# Patient Record
Sex: Male | Born: 1965
Health system: Southern US, Community
[De-identification: ages and names within clinical notes are randomized; demographics above are authoritative.]

## PROBLEM LIST (undated history)

## (undated) DIAGNOSIS — I10 Essential (primary) hypertension: Secondary | ICD-10-CM

## (undated) DIAGNOSIS — G35 Multiple sclerosis: Secondary | ICD-10-CM

## (undated) DIAGNOSIS — E785 Hyperlipidemia, unspecified: Secondary | ICD-10-CM

## (undated) DIAGNOSIS — K4020 Bilateral inguinal hernia, without obstruction or gangrene, not specified as recurrent: Secondary | ICD-10-CM

## (undated) DIAGNOSIS — N200 Calculus of kidney: Secondary | ICD-10-CM

## (undated) DIAGNOSIS — G709 Myoneural disorder, unspecified: Secondary | ICD-10-CM

## (undated) HISTORY — PX: SQUAMOUS CELL CARCINOMA EXCISION: SHX2433

## (undated) HISTORY — DX: Multiple sclerosis: G35

## (undated) HISTORY — PX: VASECTOMY: SHX75

## (undated) HISTORY — DX: Bilateral inguinal hernia, without obstruction or gangrene, not specified as recurrent: K40.20

## (undated) HISTORY — DX: Calculus of kidney: N20.0

## (undated) HISTORY — DX: Essential (primary) hypertension: I10

## (undated) HISTORY — DX: Myoneural disorder, unspecified: G70.9

## (undated) HISTORY — DX: Hyperlipidemia, unspecified: E78.5

---

## 2007-05-27 ENCOUNTER — Inpatient Hospital Stay (HOSPITAL_COMMUNITY): Admission: EM | Admit: 2007-05-27 | Discharge: 2007-05-28 | Payer: Self-pay | Admitting: Emergency Medicine

## 2011-02-06 NOTE — H&P (Signed)
[name] [name], [name] [name] NO.:  [id number]   MEDICAL RECORD NO.:  [id number]          PATIENT TYPE:  INP   LOCATION:  1846                         FACILITY:  [facility]   PHYSICIAN:  Bevelyn Buckles. Champey, M.D.DATE OF BIRTH:  10-12-1965   DATE OF ADMISSION:  05/27/2007  DATE OF DISCHARGE:                              HISTORY & PHYSICAL   REQUESTING PHYSICIAN:  Dr. Ethelda Chick   REASON FOR ADMISSION:  TIA.   HISTORY OF PRESENT ILLNESS:  [name] [name] is a 45 year old Caucasian male  with past medical history of neuropathy who presented to the emergency  room after a 10- to 15-minute episode of left upper extremity tingling  and weakness with feeling tightness in the fingers.  He is now back to  baseline.  He also felt slightly unsteady on his feet during episode and  denies any other symptoms of headaches, neck pain, vision changes,  speech changes, swallowing problems, chewing problems, vertigo or loss  of consciousness.   PAST MEDICAL HISTORY:  Positive for neuropathy.   CURRENT MEDICATIONS:  Include Neurontin which was just recently started  last week.   ALLERGIES:  PENICILLIN.   FAMILY HISTORY:  Positive for hypertension.   SOCIAL HISTORY:  The patient lives with his kids and wife.  Denies any  alcohol, tobacco or drug use.   REVIEW OF SYSTEMS:  Positive as per HPI for recent sinus  cold/congestion.  Review of systems negative as per history of present  illness in greater than seven other organ systems.   EXAMINATION:  VITAL SIGNS:  Temperature is 97.7, blood pressure 160/94,  pulse 86, respirations 88, O2 saturation 97%.  HEENT:  Normocephalic, atraumatic.  Extraocular muscles are intact.  Pupils equal, round, and react to light.  NECK:  Supple.  No carotid bruits.  HEART:  Regular.  LUNGS:  Clear.  ABDOMEN:  Soft.  EXTREMITIES:  Show good pulses.  NEUROLOGICAL EXAMINATION:  The patient is awake, alert and oriented.  Language is fluent.  Cranial nerves II-XII  grossly intact.  Motor  examination shows 4+/5 to 5/5 strength and normal tone in all four  extremities.  No drift is noted.  Sensory examination is within normal  limits to light touch.  Reflexes 1-2+ throughout and symmetric.  Toes  are neutral bilaterally.  Cerebellar function is within normal limits  finger-to-nose.  Gait is within normal limits.   PROCEDURES:  CT of the head:  The patient had no acute abnormalities and  congenital right cerebellar atrophy noted.   LABORATORIES:  WBC 6.18, hemoglobin 14.6, hematocrit is 42.2, platelets  232.  Sodium 138, potassium 4.2, chloride is 104, CO2 is 29, BUN 19,  creatinine is 1.2, glucose 104.   IMPRESSION:  This is a 45 year old with transient left upper extremity  tingling and weakness.  Will admit the patient to stroke MD.  The  patient not a candidate for IV tPA as the patient's symptoms have  resolved.  Will check an MRI/MRA of the brain and I will include an MRI  of the cervical spine.  Will get carotid Dopplers, 2-D echocardiogram.  We  will also check his fasting lipids, homocysteine level, and  hypercoagulable panel.  Will start an aspirin a day.  Will get PT/OT  consults.  Will follow the patient while he is in the hospital.      Bevelyn Buckles. Nash Shearer, M.D.  Electronically Signed     DRC/MEDQ  D:  05/27/2007  T:  05/27/2007  Job:  [id number]

## 2011-02-06 NOTE — Discharge Summary (Signed)
[name] [name], [name] NO.:  [id number]   MEDICAL RECORD NO.:  [id number]          PATIENT TYPE:  INP   LOCATION:  6743                         FACILITY:  [facility]   PHYSICIAN:  Bevelyn Buckles. Champey, M.D.DATE OF BIRTH:  [birth year]   DATE OF ADMISSION:  05/27/2007  DATE OF DISCHARGE:  05/28/2007                               DISCHARGE SUMMARY   REASON FOR ADMISSION:  Transient left upper extremity weakness/numbness.   DISCHARGE DIAGNOSES:  Demyelinating disease, probably multiple  sclerosis.   DISCHARGE MEDICATIONS:  Gabapentin 300 mg b.i.d. for 3 days and then  t.i.d.   HOSPITAL COURSE:  Please see admission H&P for details of admit note.  The patient was admitted for evaluation of possible TIA versus  structural lesions causing symptoms of left upper extremity  numbness/weakness.  MRI scan showed multiple foci and white matter on  the cervical spinal cord consistent with demyelinating disease.  Lumbar  puncture was performed with the patient's consent.  Puncture results WBC  3, RBC 490, glucose 72, protein 36.  Other results including CSF studies  are pending.  The patient had an uneventful hospital stay.  Was back to  baseline without any problems or complaints throughout his  hospitalization.  He was discharged home on Neurontin as previously  described.  We will await the results of the CSF studies.  I have given  the patient information regarding multiple sclerosis and treatment  options.  We will followup with the patient in 1 to 2 week's time to  followup CSF results and to discuss treatment options.  The patient was  also found during hospitalization to have a slightly elevated LDL of 115  and this needs to be closely followed by his primary care physician.  Other results including MRI, 2D echocardiogram and other laboratory  values are still pending upon discharge.   FOLLOWUP:  The patient is told to followup with his primary care  physician within 1 to 2  month's time.  To followup closely in regard to  his LDL and to follow with Dr. Nash Shearer in 1 to 2 week's time for  followup CSF studies and treatment options in the future.  We will  discharge the patient home.  The patient understands and agrees with the  plan.      Bevelyn Buckles. Nash Shearer, M.D.  Electronically Signed     DRC/MEDQ  D:  05/28/2007  T:  05/28/2007  Job:  [id number]

## 2011-07-06 LAB — CBC
HCT: 42.2
MCHC: 34.7
MCV: 85.9
Platelets: 232
RDW: 13.2
WBC: 6.8

## 2011-07-06 LAB — CSF CELL COUNT WITH DIFFERENTIAL
RBC Count, CSF: 490 — ABNORMAL HIGH
Tube #: 1
WBC, CSF: 3

## 2011-07-06 LAB — BETA-2-GLYCOPROTEIN I ABS, IGG/M/A
Beta-2-Glycoprotein I IgA: 9 U/mL (ref <10)
Beta-2-Glycoprotein I IgM: <4 U/mL (ref <10)

## 2011-07-06 LAB — URINALYSIS, ROUTINE W REFLEX MICROSCOPIC
Bilirubin Urine: NEGATIVE
Hgb urine dipstick: NEGATIVE
Ketones, ur: NEGATIVE
Specific Gravity, Urine: 1.014
pH: 7.5

## 2011-07-06 LAB — COMPREHENSIVE METABOLIC PANEL
Albumin: 4.2
Alkaline Phosphatase: 89
BUN: 15
Chloride: 104
Creatinine, Ser: 1
Glucose, Bld: 85
Potassium: 4.2
Total Bilirubin: 1.1
Total Protein: 7.6

## 2011-07-06 LAB — POCT I-STAT 3, ART BLOOD GAS (G3+)
Acid-Base Excess: 3 — ABNORMAL HIGH
O2 Saturation: 97
Operator id: 229971
Patient temperature: 97

## 2011-07-06 LAB — LUPUS ANTICOAGULANT PANEL
PTT Lupus Anticoagulant: 52.7 — ABNORMAL HIGH (ref 36.3–48.8)
PTTLA 4:1 Mix: 48.7 (ref 36.3–48.8)

## 2011-07-06 LAB — PROTEIN AND GLUCOSE, CSF
Glucose, CSF: 72
Total  Protein, CSF: 36

## 2011-07-06 LAB — CK TOTAL AND CKMB (NOT AT ARMC)
CK, MB: 0.8
Total CK: 69

## 2011-07-06 LAB — I-STAT 8, (EC8 V) (CONVERTED LAB)
Chloride: 104
Glucose, Bld: 104 — ABNORMAL HIGH
Hemoglobin: 15.6
Potassium: 4.2
Sodium: 138
TCO2: 29
pH, Ven: 7.381 — ABNORMAL HIGH

## 2011-07-06 LAB — DIFFERENTIAL
Basophils Absolute: 0.1
Basophils Relative: 1
Eosinophils Absolute: 0.1
Eosinophils Relative: 2
Lymphs Abs: 1.8
Neutrophils Relative %: 61

## 2011-07-06 LAB — POCT CARDIAC MARKERS
Myoglobin, poc: 67.5
Operator id: 279831

## 2011-07-06 LAB — HOMOCYSTEINE: Homocysteine: 8.7

## 2011-07-06 LAB — RAPID URINE DRUG SCREEN, HOSP PERFORMED
Amphetamines: NOT DETECTED
Barbiturates: NOT DETECTED
Benzodiazepines: NOT DETECTED
Cocaine: NOT DETECTED
Opiates: NOT DETECTED
Tetrahydrocannabinol: NOT DETECTED

## 2011-07-06 LAB — PROTHROMBIN GENE MUTATION

## 2011-07-06 LAB — HSV PCR
HSV 2 , PCR: NOT DETECTED
HSV, PCR: NOT DETECTED

## 2011-07-06 LAB — LIPASE, FLUID: Lipase-Fluid: <10

## 2011-07-06 LAB — OLIGOCLONAL BANDS, CSF + SERM
Albumin, CSF: 21 mg/dL (ref 0–35)
Albumin, Serum(Neph): 3550 mg/dL (ref 3500–5200)
CSF Oligoclonal Bands: POSITIVE — AB
IgG, Serum: 978 mg/dL (ref 768–1632)

## 2011-07-06 LAB — LIPID PANEL
Cholesterol: 189
LDL Cholesterol: 115 — ABNORMAL HIGH

## 2011-07-06 LAB — FACTOR 5 LEIDEN

## 2011-07-06 LAB — CARDIOLIPIN ANTIBODIES, IGG, IGM, IGA: Anticardiolipin IgA: <7 — ABNORMAL LOW (ref <13)

## 2011-07-06 LAB — PROTIME-INR: Prothrombin Time: 12.5

## 2011-07-06 LAB — VDRL, CSF: VDRL Quant, CSF: NONREACTIVE

## 2011-07-06 LAB — ETHANOL: Alcohol, Ethyl (B): <5

## 2011-07-06 LAB — HEMOGLOBIN A1C
Hgb A1c MFr Bld: 5.8
Mean Plasma Glucose: 129

## 2011-07-06 LAB — PROTEIN C ACTIVITY: Protein C Activity: 135 % — ABNORMAL HIGH (ref 75–133)

## 2011-07-06 LAB — PROTEIN S ACTIVITY: Protein S Activity: 145 % — ABNORMAL HIGH (ref 69–129)

## 2011-11-13 DIAGNOSIS — Z5181 Encounter for therapeutic drug level monitoring: Secondary | ICD-10-CM | POA: Insufficient documentation

## 2011-11-13 DIAGNOSIS — G35 Multiple sclerosis: Secondary | ICD-10-CM | POA: Insufficient documentation

## 2012-07-07 ENCOUNTER — Encounter (INDEPENDENT_AMBULATORY_CARE_PROVIDER_SITE_OTHER): Payer: Self-pay

## 2012-07-18 ENCOUNTER — Encounter (INDEPENDENT_AMBULATORY_CARE_PROVIDER_SITE_OTHER): Payer: Self-pay | Admitting: Surgery

## 2012-07-18 ENCOUNTER — Ambulatory Visit (INDEPENDENT_AMBULATORY_CARE_PROVIDER_SITE_OTHER): Payer: Managed Care, Other (non HMO) | Admitting: Surgery

## 2012-07-18 VITALS — BP 130/84 | HR 76 | Resp 14 | Ht 63.0 in | Wt 129.6 lb

## 2012-07-18 DIAGNOSIS — K4020 Bilateral inguinal hernia, without obstruction or gangrene, not specified as recurrent: Secondary | ICD-10-CM

## 2012-07-18 NOTE — Progress Notes (Signed)
Patient ID: [name] [name], male   DOB: 26-Aug-1966, 46 y.o.   MRN: [id number]  No chief complaint on file.   HPI [name] [name] is a 46 y.o. male.  Patient presents at the request of Dr. Isabel Caprice do to a bulge in his right groin region. It has been present for a couple of months. It is not causing severe pain. He has noticed a bulge in his left groin recently. It is not painful. Both areas slide in and out. No change in his bowel or bladder function. HPI  Past Medical History  Diagnosis Date  . Nephrolithiasis   . Neuromuscular disorder     MS    Past Surgical History  Procedure Date  . Vasectomy     Family History  Problem Relation Age of Onset  . Colon cancer      Social History History  Substance Use Topics  . Smoking status: Never Smoker   . Smokeless tobacco: Not on file  . Alcohol Use: Yes    Allergies  Allergen Reactions  . Penicillins     Current Outpatient Prescriptions  Medication Sig Dispense Refill  . Cyanocobalamin (VITAMIN B 12 PO) Take by mouth.      Marland Kitchen GABAPENTIN PO Take by mouth.      . glatiramer (COPAXONE) 20 MG/ML injection Inject 20 mg into the skin daily.      . Multiple Vitamin (MULTIVITAMIN WITH MINERALS) TABS Take 1 tablet by mouth daily.        Review of Systems Review of Systems  Constitutional: Negative for fever, chills and unexpected weight change.  HENT: Negative for hearing loss, congestion, sore throat, trouble swallowing and voice change.   Eyes: Negative for visual disturbance.  Respiratory: Negative for cough and wheezing.   Cardiovascular: Negative for chest pain, palpitations and leg swelling.  Gastrointestinal: Negative for nausea, vomiting, abdominal pain, diarrhea, constipation, blood in stool, abdominal distention, anal bleeding and rectal pain.  Genitourinary: Negative for hematuria and difficulty urinating.  Musculoskeletal: Negative for arthralgias.  Skin: Negative for rash and wound.  Neurological: Negative for  seizures, syncope, weakness and headaches.  Hematological: Negative for adenopathy. Does not bruise/bleed easily.  Psychiatric/Behavioral: Negative for confusion.    Blood pressure 130/84, pulse 76, resp. rate 14, height 5\' 3"  (1.6 m), weight 129 lb 9.6 oz (58.786 kg).  Physical Exam Physical Exam  Constitutional: He is oriented to person, place, and time. He appears well-developed and well-nourished.  HENT:  Head: Normocephalic and atraumatic.  Eyes: EOM are normal. Pupils are equal, round, and reactive to light.  Neck: Normal range of motion. Neck supple.  Cardiovascular: Normal rate and regular rhythm.   Pulmonary/Chest: Effort normal and breath sounds normal.  Abdominal:    Musculoskeletal: Normal range of motion.  Neurological: He is alert and oriented to person, place, and time.  Skin: Skin is warm and dry.  Psychiatric: He has a normal mood and affect. His behavior is normal. Judgment and thought content normal.      Assessment    BIH    Plan    Discussed options of repair versus observation. Risks, benefits and alternative therapies discussed and long term expectations. Both laparoscopic and open repairs discussed in detail the pros and cons of each discussed. He would like to proceed with laparoscopic bilateral inguinal hernia repair with mesh.The risk of hernia repair include bleeding,  Infection,   Recurrence of the hernia,  Mesh use, chronic pain,  Organ injury,  Bowel injury,  Bladder injury,   nerve injury with numbness around the incision,  Death,  and worsening of preexisting  medical problems.  The alternatives to surgery have been discussed as well..  Long term expectations of both operative and non operative treatments have been discussed.   The patient agrees to proceed.       [name] [name] A. 07/18/2012, 11:47 AM

## 2012-07-18 NOTE — Patient Instructions (Addendum)
Hernia  A hernia occurs when an internal organ pushes out through a weak spot in the abdominal wall. Hernias most commonly occur in the groin and around the navel. Hernias often can be pushed back into place (reduced). Most hernias tend to get worse over time. Some abdominal hernias can get stuck in the opening (irreducible or incarcerated hernia) and cannot be reduced. An irreducible abdominal hernia which is tightly squeezed into the opening is at risk for impaired blood supply (strangulated hernia). A strangulated hernia is a medical emergency. Because of the risk for an irreducible or strangulated hernia, surgery may be recommended to repair a hernia.  CAUSES   · Heavy lifting.  · Prolonged coughing.  · Straining to have a bowel movement.  · A cut (incision) made during an abdominal surgery.  HOME CARE INSTRUCTIONS   · Bed rest is not required. You may continue your normal activities.  · Avoid lifting more than 10 pounds (4.5 kg) or straining.  · Cough gently. If you are a smoker it is best to stop. Even the best hernia repair can break down with the continual strain of coughing. Even if you do not have your hernia repaired, a cough will continue to aggravate the problem.  · Do not wear anything tight over your hernia. Do not try to keep it in with an outside bandage or truss. These can damage abdominal contents if they are trapped within the hernia sac.  · Eat a normal diet.  · Avoid constipation. Straining over long periods of time will increase hernia size and encourage breakdown of repairs. If you cannot do this with diet alone, stool softeners may be used.  SEEK IMMEDIATE MEDICAL CARE IF:   · You have a fever.  · You develop increasing abdominal pain.  · You feel nauseous or vomit.  · Your hernia is stuck outside the abdomen, looks discolored, feels hard, or is tender.  · You have any changes in your bowel habits or in the hernia that are unusual for you.  · You have increased pain or swelling around the  hernia.  · You cannot push the hernia back in place by applying gentle pressure while lying down.  MAKE SURE YOU:   · Understand these instructions.  · Will watch your condition.  · Will get help right away if you are not doing well or get worse.  Document Released: 09/10/2005 Document Revised: 12/03/2011 Document Reviewed: 04/29/2008  ExitCare® Patient Information ©2013 [employer].    Hernia Repair with Laparoscope  A hernia occurs when an internal organ pushes out through a weak spot in the belly (abdominal) wall muscles. Hernias most commonly occur in the groin and around the navel. Hernias can also occur through a cut by the surgeon (incision) after an abdominal operation. A hernia may be caused by:  · Lifting heavy objects.  · Prolonged coughing.  · Straining to move your bowels.  Hernias can often be pushed back into place (reduced). Most hernias tend to get worse over time. Problems occur when abdominal contents get stuck in the opening and the blood supply is blocked or impaired (incarcerated hernia). Because of these risks, you require surgery to repair the hernia.  Your hernia will be repaired using a laparoscope. Laparoscopic surgery is a type of minimally invasive surgery. It does not involve making a typical surgical cut (incision) in the skin. A laparoscope is a telescope-like rod and lens system. It is usually connected to a video camera and   a light source so your caregiver can clearly see the operative area. The instruments are inserted through ¼ to ½ inch (5 mm or 10 mm) openings in the skin at specific locations. A working and viewing space is created by blowing a small amount of carbon dioxide gas into the abdominal cavity. The abdomen is essentially blown up like a balloon (insufflated). This elevates the abdominal wall above the internal organs like a dome. The carbon dioxide gas is common to the human body and can be absorbed by tissue and removed by the respiratory system. Once the repair  is completed, the small incisions will be closed with either stitches (sutures) or staples (just like a paper stapler only this staple holds the skin together).  LET YOUR CAREGIVERS KNOW ABOUT:  · Allergies.  · Medications taken including herbs, eye drops, over the counter medications, and creams.  · Use of steroids (by mouth or creams).  · Previous problems with anesthetics or Novocaine.  · Possibility of pregnancy, if this applies.  · History of blood clots (thrombophlebitis).  · History of bleeding or blood problems.  · Previous surgery.  · Other health problems.  BEFORE THE PROCEDURE   Laparoscopy can be done either in a hospital or out-patient clinic. You may be given a mild sedative to help you relax before the procedure. Once in the operating room, you will be given a general anesthesia to make you sleep (unless you and your caregiver choose a different anesthetic).   AFTER THE PROCEDURE   After the procedure you will be watched in a recovery area. Depending on what type of hernia was repaired, you might be admitted to the hospital or you might go home the same day. With this procedure you may have less pain and scarring. This usually results in a quicker recovery and less risk of infection.  HOME CARE INSTRUCTIONS   · Bed rest is not required. You may continue your normal activities but avoid heavy lifting (more than 10 pounds) or straining.  · Cough gently. If you are a smoker it is best to stop, as even the best hernia repair can break down with the continual strain of coughing.  · Avoid driving until given the OK by your surgeon.  · There are no dietary restrictions unless given otherwise.  · TAKE ALL MEDICATIONS AS DIRECTED.  · Only take over-the-counter or prescription medicines for pain, discomfort, or fever as directed by your caregiver.  SEEK MEDICAL CARE IF:   · There is increasing abdominal pain or pain in your incisions.  · There is more bleeding from incisions, other than minimal spotting.  · You  feel light headed or faint.  · You develop an unexplained fever, chills, and/or an oral temperature above 102° F (38.9° C).  · You have redness, swelling, or increasing pain in the wound.  · Pus coming from wound.  · A foul smell coming from the wound or dressings.  SEEK IMMEDIATE MEDICAL CARE IF:   · You develop a rash.  · You have difficulty breathing.  · You have any allergic problems.  MAKE SURE YOU:   · Understand these instructions.  · Will watch your condition.  · Will get help right away if you are not doing well or get worse.  Document Released: 09/10/2005 Document Revised: 12/03/2011 Document Reviewed: 08/10/2009  ExitCare® Patient Information ©2013 [employer].

## 2012-08-19 ENCOUNTER — Other Ambulatory Visit (INDEPENDENT_AMBULATORY_CARE_PROVIDER_SITE_OTHER): Payer: Self-pay | Admitting: Surgery

## 2012-08-19 ENCOUNTER — Ambulatory Visit
Admission: RE | Admit: 2012-08-19 | Discharge: 2012-08-19 | Disposition: A | Discharge status: Home / Self Care | Payer: Managed Care, Other (non HMO) | Source: Ambulatory Visit | Attending: Surgery | Admitting: Surgery

## 2012-08-19 DIAGNOSIS — Z01811 Encounter for preprocedural respiratory examination: Secondary | ICD-10-CM

## 2012-08-27 DIAGNOSIS — K4020 Bilateral inguinal hernia, without obstruction or gangrene, not specified as recurrent: Secondary | ICD-10-CM

## 2012-08-27 HISTORY — PX: HERNIA REPAIR: SHX51

## 2012-09-12 ENCOUNTER — Encounter (INDEPENDENT_AMBULATORY_CARE_PROVIDER_SITE_OTHER): Payer: Self-pay | Admitting: Surgery

## 2012-09-12 ENCOUNTER — Ambulatory Visit (INDEPENDENT_AMBULATORY_CARE_PROVIDER_SITE_OTHER): Payer: Managed Care, Other (non HMO) | Admitting: Surgery

## 2012-09-12 VITALS — BP 120/76 | HR 74 | Temp 97.6°F | Resp 14 | Ht 64.0 in | Wt 127.2 lb

## 2012-09-12 DIAGNOSIS — Z9889 Other specified postprocedural states: Secondary | ICD-10-CM

## 2012-09-12 NOTE — Patient Instructions (Signed)
Lift up to 25 lbs for 2 weeks then no restriction.  If swelling not gone in 3 months or increasing pain return.

## 2012-09-12 NOTE — Progress Notes (Signed)
Pt returns today after laparoscopic bilateral inguinal  hernia repair.  Pain is well controlled.  Bowels are functioning.  Wound is clean.  On exam:  Incision is clean /dry/intact.  Area is soft without signs of hernia recurrence. Right groin seroma noted  Impression:  Status repair of hernia laparoscopic bilateral inguinal   Plan:  RTC PRN  Return to work in   1 Weeks. If swelling in right groin not gone in 2 - 3 months or increasing pain return to clinic

## 2013-01-07 ENCOUNTER — Encounter: Payer: Self-pay | Admitting: Neurology

## 2013-01-07 DIAGNOSIS — G35 Multiple sclerosis: Secondary | ICD-10-CM

## 2013-01-07 DIAGNOSIS — Z5181 Encounter for therapeutic drug level monitoring: Secondary | ICD-10-CM

## 2013-01-13 ENCOUNTER — Ambulatory Visit (INDEPENDENT_AMBULATORY_CARE_PROVIDER_SITE_OTHER): Payer: Managed Care, Other (non HMO) | Admitting: Neurology

## 2013-01-13 ENCOUNTER — Encounter: Payer: Self-pay | Admitting: Neurology

## 2013-01-13 VITALS — BP 113/71 | HR 67 | Ht 65.5 in | Wt 128.0 lb

## 2013-01-13 DIAGNOSIS — Z5181 Encounter for therapeutic drug level monitoring: Secondary | ICD-10-CM

## 2013-01-13 DIAGNOSIS — G35 Multiple sclerosis: Secondary | ICD-10-CM

## 2013-01-13 NOTE — Progress Notes (Signed)
Reason for visit: Multiple sclerosis  [name] [name] is an 47 y.o. male  History of present illness:  [name] [name] is a 47 year old right-handed white male with a history of multiple sclerosis. The patient indicates that he has done quite well since last seen. The patient has had no new symptoms of numbness, weakness, gait instability, vision changes, speech changes, swallowing changes, or problems with the bowels or the bladder. The patient is on Copaxone, and he is tolerating the medication well. The patient reports no skin reactions. The patient denies any new significant medical issues that have come up since last seen. The patient returns for an evaluation.  Past Medical History  Diagnosis Date  . Nephrolithiasis   . Neuromuscular disorder     MS  . Bilateral inguinal hernia   . Multiple sclerosis     Past Surgical History  Procedure Laterality Date  . Vasectomy    . Hernia repair  08/27/12    bilateral repair with mesh  . Squamous cell carcinoma excision      From the forehead    Family History  Problem Relation Age of Onset  . Colon cancer    . Heart attack Sister     Social history:  reports that he has never smoked. He has never used smokeless tobacco. He reports that  drinks alcohol. He reports that he does not use illicit drugs.  Allergies:  Allergies  Allergen Reactions  . Penicillins Other (See Comments)    Reaction in childhood. Does not remember.    Medications:  Current Outpatient Prescriptions on File Prior to Visit  Medication Sig Dispense Refill  . Cyanocobalamin (VITAMIN B 12 PO) Take by mouth.      . gabapentin (NEURONTIN) 300 MG capsule Take 300 mg by mouth 3 (three) times daily.      Marland Kitchen glatiramer (COPAXONE) 20 MG/ML injection Inject 20 mg into the skin daily.      . Multiple Vitamin (MULTIVITAMIN WITH MINERALS) TABS Take 1 tablet by mouth daily.       No current facility-administered medications on file prior to visit.    ROS:  Out of a  complete 14 system review of symptoms, the patient complains only of the following symptoms, and all other reviewed systems are negative.  Numbness Allergies Moles Insomnia  Blood pressure 113/71, pulse 67, height 5' 5.5" (1.664 m), weight 128 lb (58.06 kg).  Physical Exam  General: The patient is alert and cooperative at the time of the examination.  Skin: No significant peripheral edema is noted.   Neurologic Exam  Cranial nerves: Facial symmetry is present. Speech is normal, no aphasia or dysarthria is noted. Extraocular movements are full. Visual fields are full. Pupils are equal, round, and reactive to light. Discs are flat bilaterally.  Motor: The patient has good strength in all 4 extremities.  Coordination: The patient has good finger-nose-finger and heel-to-shin bilaterally.  Gait and station: The patient has a normal gait. Tandem gait is normal. Romberg is negative. No drift is seen.  Reflexes: Deep tendon reflexes are symmetric.   Assessment/Plan:  One. Multiple sclerosis  The patient is doing quite well at this point on Copaxone. The patient may be switched to the 3 times a week injectable form of Copaxone at this time. The patient will otherwise followup in one year. Blood work will be done today.  Marlan Palau MD 01/13/2013 8:32 PM  [facility] [address] [place], [place] [address]  Phone  [phone] Fax [phone]

## 2013-01-14 ENCOUNTER — Telehealth: Payer: Self-pay | Admitting: Neurology

## 2013-01-14 LAB — COMPREHENSIVE METABOLIC PANEL
AST: 25 IU/L (ref 0–40)
Albumin: 4.6 g/dL (ref 3.5–5.5)
Alkaline Phosphatase: 81 IU/L (ref 39–117)
BUN/Creatinine Ratio: 30 — ABNORMAL HIGH (ref 9–20)
BUN: 33 mg/dL — ABNORMAL HIGH (ref 6–24)
CO2: 23 mmol/L (ref 19–28)
Creatinine, Ser: 1.1 mg/dL (ref 0.76–1.27)
GFR calc Af Amer: 93 mL/min/{1.73_m2} (ref >59)
Globulin, Total: 2.2 g/dL (ref 1.5–4.5)
Sodium: 140 mmol/L (ref 134–144)
Total Bilirubin: 0.3 mg/dL (ref 0.0–1.2)

## 2013-01-14 LAB — CBC WITH DIFFERENTIAL/PLATELET
Basophils Absolute: 0 10*3/uL (ref 0.0–0.2)
Eosinophils Absolute: 0.2 10*3/uL (ref 0.0–0.4)
Immature Grans (Abs): 0 10*3/uL (ref 0.0–0.1)
Immature Granulocytes: 0 % (ref 0–2)
Lymphs: 19 % (ref 14–46)
MCH: 29.5 pg (ref 26.6–33.0)
MCHC: 34.1 g/dL (ref 31.5–35.7)
MCV: 87 fL (ref 79–97)
Monocytes Absolute: 1 10*3/uL — ABNORMAL HIGH (ref 0.1–0.9)
Neutrophils Relative %: 71 % (ref 40–74)
RBC: 4.81 x10E6/uL (ref 4.14–5.80)
RDW: 13.5 % (ref 12.3–15.4)

## 2013-01-14 NOTE — Telephone Encounter (Signed)
I Called the patient. The blood work reveals evidence of a slightly elevated white blood count. The patient is contact me if he believes that there are any infectious issues such as sinusitis. The patient otherwise had a slightly elevated BUN, otherwise normal creatinine.

## 2013-02-17 ENCOUNTER — Other Ambulatory Visit: Payer: Self-pay

## 2013-02-17 MED ORDER — GLATIRAMER ACETATE 40 MG/ML ~~LOC~~ SOSY: 40.0000 mg | PREFILLED_SYRINGE | SUBCUTANEOUS | Status: DC

## 2013-02-21 ENCOUNTER — Other Ambulatory Visit: Payer: Self-pay | Admitting: Neurology

## 2013-07-28 ENCOUNTER — Other Ambulatory Visit: Payer: Self-pay | Admitting: Neurology

## 2013-09-30 ENCOUNTER — Telehealth: Payer: Self-pay | Admitting: Neurology

## 2013-09-30 ENCOUNTER — Ambulatory Visit: Payer: Managed Care, Other (non HMO) | Admitting: Neurology

## 2013-09-30 NOTE — Telephone Encounter (Signed)
This patient did not show for the revisit appointment today.

## 2013-10-05 ENCOUNTER — Ambulatory Visit: Payer: Managed Care, Other (non HMO) | Admitting: Neurology

## 2014-01-15 ENCOUNTER — Telehealth: Payer: Self-pay | Admitting: Neurology

## 2014-01-15 NOTE — Telephone Encounter (Signed)
I called patient. Being off of Copaxone for one month likely will not harm things, but I will see if I can get free samples through the pharmaceutical copy for the month of May.

## 2014-01-15 NOTE — Telephone Encounter (Signed)
I called the patient. We have samples apparently of Copaxone in the sample room. He will come in Monday to get the medication. The pharmaceutical representative will bring in more samples to help supply him through the month of May.

## 2014-01-15 NOTE — Telephone Encounter (Signed)
Pt calling stating that he is between insurance policies and his new insurance starts 02/22/14 and pt wanted to know if he could be off of copaxone 40 mg for one month or is there another option for him. Please advise

## 2014-01-15 NOTE — Telephone Encounter (Signed)
Message copied by Margette Fast on Fri Jan 15, 2014  4:55 PM ------      Message from: Yates Decamp      Created: Fri Jan 15, 2014  2:14 PM      Regarding: Copaxone       Dr Jannifer Franklin,      We can absolutely provide a month of Copaxone samples for this patient.  We have some on hand at the office.  They are in the refrigerator behind the door in the sample room (underneath the shelves that have overstock of samples).  I contacted the rep, who will also bring more in next week.      Hope you have a great weekend!      Thanks!      JCB   ------

## 2014-01-15 NOTE — Telephone Encounter (Signed)
In between insurance policies. And new insurance starts 02/22/14 and he needs to know if it will be ok for him to be off of his Glatiramer Acetate (COPAXONE) 40 MG/ML SOSY  for one month and if there are any other options for him

## 2014-01-18 NOTE — Telephone Encounter (Signed)
Pt came into the office today to pick up a month supply of Copaxone.

## 2014-02-24 ENCOUNTER — Telehealth: Payer: Self-pay | Admitting: Neurology

## 2014-02-24 MED ORDER — GLATIRAMER ACETATE 40 MG/ML ~~LOC~~ SOSY: 40.0000 mg | PREFILLED_SYRINGE | SUBCUTANEOUS | Status: DC

## 2014-02-24 NOTE — Telephone Encounter (Signed)
Pharmacy has been added and refill has been sent.

## 2014-02-24 NOTE — Telephone Encounter (Signed)
Patient calling to state that he has a new Geneticist, molecular and the pharmacy will be [facility] [id number] [id number]. Giving information so he can get his Copaxone refills

## 2014-03-02 ENCOUNTER — Encounter: Payer: Self-pay | Admitting: Neurology

## 2014-03-15 ENCOUNTER — Ambulatory Visit: Payer: Managed Care, Other (non HMO) | Admitting: Neurology

## 2014-03-23 ENCOUNTER — Other Ambulatory Visit: Payer: Self-pay | Admitting: Neurology

## 2014-04-05 ENCOUNTER — Encounter: Payer: Self-pay | Admitting: Neurology

## 2014-04-05 ENCOUNTER — Ambulatory Visit (INDEPENDENT_AMBULATORY_CARE_PROVIDER_SITE_OTHER): Payer: 59 | Admitting: Neurology

## 2014-04-05 ENCOUNTER — Encounter (INDEPENDENT_AMBULATORY_CARE_PROVIDER_SITE_OTHER): Payer: Self-pay

## 2014-04-05 VITALS — BP 123/73 | HR 66 | Wt 134.0 lb

## 2014-04-05 DIAGNOSIS — G35 Multiple sclerosis: Secondary | ICD-10-CM

## 2014-04-05 DIAGNOSIS — Z5181 Encounter for therapeutic drug level monitoring: Secondary | ICD-10-CM

## 2014-04-05 LAB — COMPREHENSIVE METABOLIC PANEL
ALBUMIN: 4.4 g/dL (ref 3.5–5.5)
ALK PHOS: 71 IU/L (ref 39–117)
ALT: 9 IU/L (ref 0–44)
AST: 13 IU/L (ref 0–40)
Albumin/Globulin Ratio: 1.8 (ref 1.1–2.5)
BILIRUBIN TOTAL: 0.4 mg/dL (ref 0.0–1.2)
BUN / CREAT RATIO: 17 (ref 9–20)
BUN: 19 mg/dL (ref 6–24)
CO2: 30 mmol/L — AB (ref 18–29)
CREATININE: 1.15 mg/dL (ref 0.76–1.27)
Calcium: 9 mg/dL (ref 8.7–10.2)
Chloride: 102 mmol/L (ref 96–108)
GFR calc non Af Amer: 76 mL/min/{1.73_m2} (ref >59)
GFR, EST AFRICAN AMERICAN: 88 mL/min/{1.73_m2} (ref >59)
GLOBULIN, TOTAL: 2.4 g/dL (ref 1.5–4.5)
Glucose: 91 mg/dL (ref 65–99)
Potassium: 4.4 mmol/L (ref 3.5–5.2)
Sodium: 140 mmol/L (ref 134–144)
Total Protein: 6.8 g/dL (ref 6.0–8.5)

## 2014-04-05 LAB — CBC WITH DIFFERENTIAL
Basophils Absolute: 0.1 10*3/uL (ref 0.0–0.2)
Basos: 1 %
EOS: 7 %
Eosinophils Absolute: 0.4 10*3/uL (ref 0.0–0.4)
HCT: 40 % (ref 37.5–51.0)
Hemoglobin: 13.8 g/dL (ref 12.6–17.7)
LYMPHS ABS: 2.5 10*3/uL (ref 0.7–3.1)
Lymphs: 40 %
MCH: 29.8 pg (ref 26.6–33.0)
MCHC: 34.5 g/dL (ref 31.5–35.7)
MCV: 86 fL (ref 79–97)
MONOS ABS: 0.8 10*3/uL (ref 0.1–0.9)
Monocytes: 13 %
NEUTROS ABS: 2.5 10*3/uL (ref 1.4–7.0)
Neutrophils Relative %: 39 %
PLATELETS: 223 10*3/uL (ref 150–379)
RBC: 4.63 x10E6/uL (ref 4.14–5.80)
RDW: 13.4 % (ref 12.3–15.4)
WBC: 6.2 10*3/uL (ref 3.4–10.8)

## 2014-04-05 MED ORDER — GABAPENTIN 300 MG PO CAPS: 300.0000 mg | ORAL_CAPSULE | Freq: Three times a day (TID) | ORAL | Status: DC

## 2014-04-05 NOTE — Progress Notes (Signed)
Reason for visit: Multiple sclerosis  [name] [name] is an 48 y.o. male  History of present illness:  [name] is a 48 year old right-handed white male with a history of multiple sclerosis. The patient has been on Copaxone, and he is tolerating the medication well. He last had MRI evaluation the brain in February 2013 which was stable from the prior study in 2008. He denies any injection site reactions or other side effects on the Copaxone. He reports no new issues with numbness, weakness, balance problems, visual field changes, or bowel or bladder control problems. he does have some burning sensations in the feet, and he takes gabapentin for this. This helps him sleep. The patient actually takes only 300 mg twice daily, not 3 times daily. He is on vitamin B12 supplementation, but he does not currently take vitamin D. He returns for an evaluation. The patient indicates that he gets annual ophthalmologic evaluations through Dr. Herbert Deaner. The patient does report some irritability that is a new issue for him.  Past Medical History  Diagnosis Date  . Nephrolithiasis   . Neuromuscular disorder     MS  . Bilateral inguinal hernia   . Multiple sclerosis     Past Surgical History  Procedure Laterality Date  . Vasectomy    . Hernia repair  08/27/12    bilateral repair with mesh  . Squamous cell carcinoma excision      From the forehead    Family History  Problem Relation Age of Onset  . Colon cancer    . Heart attack Sister     Social history:  reports that he has never smoked. He has never used smokeless tobacco. He reports that he drinks alcohol. He reports that he does not use illicit drugs.    Allergies  Allergen Reactions  . Penicillins Other (See Comments)    Reaction in childhood. Does not remember.    Medications:  Current Outpatient Prescriptions on File Prior to Visit  Medication Sig Dispense Refill  . COPAXONE 40 MG/ML SOSY Inject subcutaneously 40mg   three times  weekly  12 mL  1  . Multiple Vitamin (MULTIVITAMIN WITH MINERALS) TABS Take 1 tablet by mouth daily.      . Olopatadine HCl (PATANASE) 0.6 % SOLN Place 1 each into the nose at bedtime.       No current facility-administered medications on file prior to visit.    ROS:  Out of a complete 14 system review of symptoms, the patient complains only of the following symptoms, and all other reviewed systems are negative.  Environmental allergies Numbness Agitation Moles Snoring  Blood pressure 123/73, pulse 66, weight 134 lb (60.782 kg).  Physical Exam  General: The patient is alert and cooperative at the time of the examination.  Skin: No significant peripheral edema is noted.   Neurologic Exam  Mental status: The patient is oriented x 3.  Cranial nerves: Facial symmetry is present. Speech is normal, no aphasia or dysarthria is noted. Extraocular movements are full. Visual fields are full. Pupils are equal, round, and reactive to light. Discs are flat bilaterally.  Motor: The patient has good strength in all 4 extremities.  Sensory examination: Soft touch sensation is symmetric on the face, arms, and legs.  Coordination: The patient has good finger-nose-finger and heel-to-shin bilaterally.  Gait and station: The patient has a normal gait. Tandem gait is normal. Romberg is negative. No drift is seen.  Reflexes: Deep tendon reflexes are symmetric.   MRI  brain 11/22/11:   Impression: Abnormal MRI brain (with and without contrast) demonstrating: 1. Hazy T2 hyperintense lesions in the posterior limb of internal capsule on the right, and a few small lesions in the left periventricular and subcortical regions. Some of these hyperintense on T1 views. Consistent with chronic demyelinating plaques. 2. No acute demyelinating plaques.  3. In comparison to the prior MRI from 05/28/07, there are no new lesions.   Assessment/Plan:  One. Multiple sclerosis  The patient is doing well on  Copaxone currently. He will have blood work done today. We will see him back in about 6 months, and we will consider getting MRI evaluation of the brain at that time.  Jill Alexanders MD 04/05/2014 7:53 PM  [facility] [address] [place] [address], [place] [address]  Phone [phone] Fax [phone]

## 2014-04-05 NOTE — Patient Instructions (Signed)

## 2014-04-06 NOTE — Progress Notes (Signed)
Quick Note:  Shared unremarkable results thru VM message ______

## 2014-07-07 ENCOUNTER — Other Ambulatory Visit: Payer: Self-pay | Admitting: Neurology

## 2014-10-06 ENCOUNTER — Ambulatory Visit (INDEPENDENT_AMBULATORY_CARE_PROVIDER_SITE_OTHER): Payer: BLUE CROSS/BLUE SHIELD | Admitting: Adult Health

## 2014-10-06 ENCOUNTER — Encounter: Payer: Self-pay | Admitting: Adult Health

## 2014-10-06 VITALS — BP 133/87 | HR 69 | Ht 65.0 in | Wt 144.8 lb

## 2014-10-06 DIAGNOSIS — Z5181 Encounter for therapeutic drug level monitoring: Secondary | ICD-10-CM

## 2014-10-06 DIAGNOSIS — G35 Multiple sclerosis: Secondary | ICD-10-CM

## 2014-10-06 MED ORDER — GLATIRAMER ACETATE 40 MG/ML ~~LOC~~ SOSY: PREFILLED_SYRINGE | SUBCUTANEOUS | Status: DC

## 2014-10-06 MED ORDER — GABAPENTIN 300 MG PO CAPS: 300.0000 mg | ORAL_CAPSULE | Freq: Three times a day (TID) | ORAL | Status: DC

## 2014-10-06 NOTE — Patient Instructions (Signed)
Overall you are doing well.  Continue Copaxone.  If symptoms worsen or you develops new symptoms please let us know.

## 2014-10-06 NOTE — Progress Notes (Signed)
PATIENT: [name] [name] DOB: 07-01-1966  REASON FOR VISIT: follow up- multiple sclerosis HISTORY FROM: patient  HISTORY OF PRESENT ILLNESS: [name] [name] is a 49 year old male with a history of multiple sclerosis. He returns today for follow-up. He is currently taking Copaxone and tolerating the medication well. The patient denies any new numbness or weakness. Denies any changes in his balance or gait. No changes in his bowels or bladder. Denies any vision changes. The patient continues to take gabapentin for discomfort in the feet. He states that this is working well. No new medical issues since last seen.  HISTORY 04/05/14 (WILLIS): [name] [name] is a 49 year old right-handed white male with a history of multiple sclerosis. The patient has been on Copaxone, and he is tolerating the medication well. He last had MRI evaluation the brain in February 2013 which was stable from the prior study in 2008. He denies any injection site reactions or other side effects on the Copaxone. He reports no new issues with numbness, weakness, balance problems, visual field changes, or bowel or bladder control problems. he does have some burning sensations in the feet, and he takes gabapentin for this. This helps him sleep. The patient actually takes only 300 mg twice daily, not 3 times daily. He is on vitamin B12 supplementation, but he does not currently take vitamin D. He returns for an evaluation. The patient indicates that he gets annual ophthalmologic evaluations through Dr. Herbert Deaner. The patient does report some irritability that is a new issue for him.  REVIEW OF SYSTEMS: Out of a complete 14 system review of symptoms, the patient complains only of the following symptoms, and all other reviewed systems are negative.  Constipation, difficulty urinating, environmental allergies, numbness, snoring  ALLERGIES: Allergies  Allergen Reactions  . Penicillins Other (See Comments)    Reaction in childhood. Does not  remember.    HOME MEDICATIONS: Outpatient Prescriptions Prior to Visit  Medication Sig Dispense Refill  . cholecalciferol (VITAMIN D) 1000 UNITS tablet Take 1,000 Units by mouth daily.    Marland Kitchen COPAXONE 40 MG/ML SOSY Inject subcutaneously 40mg   three times weekly 12 mL 6  . gabapentin (NEURONTIN) 300 MG capsule Take 1 capsule (300 mg total) by mouth 3 (three) times daily. 270 capsule 3  . Multiple Vitamin (MULTIVITAMIN WITH MINERALS) TABS Take 1 tablet by mouth daily.    . Olopatadine HCl (PATANASE) 0.6 % SOLN Place 1 each into the nose at bedtime.    . vitamin B-12 (CYANOCOBALAMIN) 1000 MCG tablet Take 1,000 mcg by mouth daily.     No facility-administered medications prior to visit.    PAST MEDICAL HISTORY: Past Medical History  Diagnosis Date  . Nephrolithiasis   . Neuromuscular disorder     MS  . Bilateral inguinal hernia   . Multiple sclerosis     PAST SURGICAL HISTORY: Past Surgical History  Procedure Laterality Date  . Vasectomy    . Hernia repair  08/27/12    bilateral repair with mesh  . Squamous cell carcinoma excision      From the forehead    FAMILY HISTORY: Family History  Problem Relation Age of Onset  . Colon cancer    . Heart attack Sister     SOCIAL HISTORY: History   Social History  . Marital Status: Married    Spouse Name: N/A    Number of Children: 3  . Years of Education: College   Occupational History  .     Social History  Main Topics  . Smoking status: Never Smoker   . Smokeless tobacco: Never Used  . Alcohol Use: Yes  . Drug Use: No  . Sexual Activity: Not on file   Other Topics Concern  . Not on file   Social History Narrative      PHYSICAL EXAM  Filed Vitals:   10/06/14 1319  BP: 133/87  Pulse: 69  Height: 5\' 5"  (1.651 m)  Weight: 144 lb 12.8 oz (65.681 kg)   Body mass index is 24.1 kg/(m^2).  Generalized: Well developed, in no acute distress   Neurological examination  Mentation: Alert oriented to time, place,  history taking. Follows all commands speech and language fluent Cranial nerve II-XII: Pupils were equal round reactive to light. Extraocular movements were full, visual field were full on confrontational test. Facial sensation and strength were normal. Uvula tongue midline. Head turning and shoulder shrug  were normal and symmetric. Motor: The motor testing reveals 5 over 5 strength of all 4 extremities. Good symmetric motor tone is noted throughout.  Sensory: Sensory testing is intact to soft touch on all 4 extremities. No evidence of extinction is noted.  Coordination: Cerebellar testing reveals good finger-nose-finger and heel-to-shin bilaterally.  Gait and station: Gait is normal. Tandem gait is normal. Romberg is negative. No drift is seen.  Reflexes: Deep tendon reflexes are symmetric and normal bilaterally.    DIAGNOSTIC DATA (LABS, IMAGING, TESTING) - I reviewed patient records, labs, notes, testing and imaging myself where available.  MRI BRAIN 2013- no progression when compared to scan in 2008  Lab Results  Component Value Date   WBC 6.2 04/05/2014   HGB 13.8 04/05/2014   HCT 40.0 04/05/2014   MCV 86 04/05/2014   PLT 223 04/05/2014      Component Value Date/Time   NA 140 04/05/2014 1023   NA 138 05/27/2007 1405   K 4.4 04/05/2014 1023   CL 102 04/05/2014 1023   CO2 30* 04/05/2014 1023   GLUCOSE 91 04/05/2014 1023   GLUCOSE 85 05/27/2007 1405   BUN 19 04/05/2014 1023   BUN 15 05/27/2007 1405   CREATININE 1.15 04/05/2014 1023   CALCIUM 9.0 04/05/2014 1023   PROT 6.8 04/05/2014 1023   PROT 7.6 05/27/2007 1405   ALBUMIN 4.2 05/27/2007 1405   AST 13 04/05/2014 1023   ALT 9 04/05/2014 1023   ALKPHOS 71 04/05/2014 1023   BILITOT 0.4 04/05/2014 1023   GFRNONAA 76 04/05/2014 1023   GFRAA 88 04/05/2014 1023   Lab Results  Component Value Date   CHOL  05/27/2007    189        ATP III CLASSIFICATION:  <200     mg/dL   Desirable  200-239  mg/dL   Borderline High   >=240    mg/dL   High   HDL 27* 05/27/2007   LDLCALC * 05/27/2007    115        Total Cholesterol/HDL:CHD Risk Coronary Heart Disease Risk Table                     Men   Women  1/2 Average Risk   3.4   3.3   TRIG 237* 05/27/2007   CHOLHDL 7.0 05/27/2007   Lab Results  Component Value Date   HGBA1C  05/27/2007    5.8 (NOTE)   The ADA recommends the following therapeutic goals for glycemic   control related to Hgb A1C measurement:   Goal of Therapy:   <  7.0% Hgb A1C   Action Suggested:  > 8.0% Hgb A1C   Ref:  Diabetes Care, 61, Suppl. 1, 1999      ASSESSMENT AND PLAN 49 y.o. year old male  has a past medical history of Nephrolithiasis; Neuromuscular disorder; Bilateral inguinal hernia; and Multiple sclerosis. here with:  1. Multiple sclerosis  Overall the patient is doing well today. He will continue taking Copaxone injections. I sent a new prescription to his new pharmacy. The patient will continue using gabapentin for discomfort in the feet. I sent a new prescription to his new pharmacy. The patient's last MRI was in 2013 and showed no progression from the previous scan in 2008. I will order a MRI of the brain to look for progression of MS. I will check blood work today. If his symptoms worsen or he develops new symptoms he should let us know. Otherwise he'll follow-up in 6 months or sooner if needed.   Ward Givens, MSN, NP-C 10/06/2014, 1:32 PM [facility] [address] [place], [place] [address] [phone]  Note: This document was prepared with digital dictation and possible smart phrase technology. Any transcriptional errors that result from this process are unintentional.

## 2014-10-06 NOTE — Progress Notes (Signed)
I have read the note, and I agree with the clinical assessment and plan.  WILLIS,CHARLES KEITH   

## 2014-10-07 LAB — CBC WITH DIFFERENTIAL
Basophils Absolute: 0.1 10*3/uL (ref 0.0–0.2)
Basos: 1 %
Eos: 6 %
Eosinophils Absolute: 0.4 10*3/uL (ref 0.0–0.4)
HCT: 40.6 % (ref 37.5–51.0)
Hemoglobin: 14.2 g/dL (ref 12.6–17.7)
Immature Grans (Abs): 0 10*3/uL (ref 0.0–0.1)
Immature Granulocytes: 0 %
Lymphocytes Absolute: 2.7 10*3/uL (ref 0.7–3.1)
Lymphs: 42 %
MCH: 29.6 pg (ref 26.6–33.0)
MCHC: 35 g/dL (ref 31.5–35.7)
MCV: 85 fL (ref 79–97)
MONOCYTES: 12 %
MONOS ABS: 0.8 10*3/uL (ref 0.1–0.9)
NEUTROS PCT: 39 %
Neutrophils Absolute: 2.6 10*3/uL (ref 1.4–7.0)
Platelets: 238 10*3/uL (ref 150–379)
RBC: 4.8 x10E6/uL (ref 4.14–5.80)
RDW: 13 % (ref 12.3–15.4)
WBC: 6.6 10*3/uL (ref 3.4–10.8)

## 2014-10-07 LAB — COMPREHENSIVE METABOLIC PANEL
ALT: 9 IU/L (ref 0–44)
AST: 13 IU/L (ref 0–40)
Albumin/Globulin Ratio: 1.8 (ref 1.1–2.5)
Albumin: 4.4 g/dL (ref 3.5–5.5)
Alkaline Phosphatase: 72 IU/L (ref 39–117)
BILIRUBIN TOTAL: 0.3 mg/dL (ref 0.0–1.2)
BUN / CREAT RATIO: 17 (ref 9–20)
BUN: 20 mg/dL (ref 6–24)
CO2: 23 mmol/L (ref 18–29)
Calcium: 9.2 mg/dL (ref 8.7–10.2)
Chloride: 102 mmol/L (ref 97–108)
Creatinine, Ser: 1.19 mg/dL (ref 0.76–1.27)
GFR calc Af Amer: 84 mL/min/{1.73_m2} (ref >59)
GFR, EST NON AFRICAN AMERICAN: 72 mL/min/{1.73_m2} (ref >59)
Globulin, Total: 2.5 g/dL (ref 1.5–4.5)
Glucose: 103 mg/dL — ABNORMAL HIGH (ref 65–99)
POTASSIUM: 4.5 mmol/L (ref 3.5–5.2)
Sodium: 140 mmol/L (ref 134–144)
Total Protein: 6.9 g/dL (ref 6.0–8.5)

## 2014-11-10 ENCOUNTER — Telehealth: Payer: Self-pay | Admitting: Adult Health

## 2014-11-10 NOTE — Telephone Encounter (Signed)
I contacted ins and provided all clinical info.  Request is under review.  I called the patient back, got no answer.  Left message.

## 2014-11-10 NOTE — Telephone Encounter (Signed)
Patient stated [employer] needing additional information for Rx Glatiramer Acetate (COPAXONE) 40 MG/ML SOSY.  Please call and advise.

## 2014-11-30 ENCOUNTER — Telehealth: Payer: Self-pay | Admitting: Adult Health

## 2014-11-30 MED ORDER — GLATIRAMER ACETATE 40 MG/ML ~~LOC~~ SOSY
40.0000 mg | PREFILLED_SYRINGE | SUBCUTANEOUS | Status: DC
Start: 2014-11-30 — End: 2015-09-16

## 2014-11-30 NOTE — Telephone Encounter (Signed)
[facility] is calling stating they need a Rx for Glatiramer Acetate (COPAXONE) 40 MG/ML SOSY to be faxed to them at [phone] along with the Rx they need ICD 910 and Drug and allergy list.  If you have any questions please call.

## 2015-04-06 ENCOUNTER — Encounter: Payer: Self-pay | Admitting: Adult Health

## 2015-04-06 ENCOUNTER — Ambulatory Visit (INDEPENDENT_AMBULATORY_CARE_PROVIDER_SITE_OTHER): Payer: BLUE CROSS/BLUE SHIELD | Admitting: Adult Health

## 2015-04-06 VITALS — BP 126/90 | HR 72 | Ht 65.0 in | Wt 141.0 lb

## 2015-04-06 DIAGNOSIS — G35 Multiple sclerosis: Secondary | ICD-10-CM

## 2015-04-06 NOTE — Progress Notes (Signed)
I have read the note, and I agree with the clinical assessment and plan.  Arlena Marsan KEITH   

## 2015-04-06 NOTE — Progress Notes (Signed)
PATIENT: [name] [name] DOB: Jan 24, 1966  REASON FOR VISIT: follow up- multiple sclerosis  HISTORY FROM: patient  HISTORY OF PRESENT ILLNESS: [name] [name] is a 49 year old male with a history of multiple sclerosis. He returns today for follow-up. He is currently taking Copaxone and tolerating it well. The patient denies any new numbness or weakness. Denies any changes in his gait or balance. No changes in his bowels or bladder. He does state that he's noticed that he's had more difficulty with urinating. He states that he feels that he has to go but it takes him a while to urinate. Denies urinary frequency or urgency. No changes in his vision. The patient does state that he just returned home for [place]. He states while there it was very hot outside and they were doing a lot of walking. He states that he's felt a little more fatigued after the trip but is slowly getting back to normal. He denies any new medical issues. Returns today for an evaluation.  HISTORY 10/06/14: [name] [name] is a 49 year old male with a history of multiple sclerosis. He returns today for follow-up. He is currently taking Copaxone and tolerating the medication well. The patient denies any new numbness or weakness. Denies any changes in his balance or gait. No changes in his bowels or bladder. Denies any vision changes. The patient continues to take gabapentin for discomfort in the feet. He states that this is working well. No new medical issues since last seen.  HISTORY 04/05/14 (WILLIS): [name] [name] is a 49 year old right-handed white male with a history of multiple sclerosis. The patient has been on Copaxone, and he is tolerating the medication well. He last had MRI evaluation the brain in February 2013 which was stable from the prior study in 2008. He denies any injection site reactions or other side effects on the Copaxone. He reports no new issues with numbness, weakness, balance problems, visual field changes, or bowel  or bladder control problems. he does have some burning sensations in the feet, and he takes gabapentin for this. This helps him sleep. The patient actually takes only 300 mg twice daily, not 3 times daily. He is on vitamin B12 supplementation, but he does not currently take vitamin D. He returns for an evaluation. The patient indicates that he gets annual ophthalmologic evaluations through Dr. Herbert Deaner. The patient does report some irritability that is a new issue for him.   REVIEW OF SYSTEMS: Out of a complete 14 system review of symptoms, the patient complains only of the following symptoms, and all other reviewed systems are negative.  Environmental allergies, difficulty urinating, and numbness, muscle cramps  ALLERGIES: Allergies  Allergen Reactions  . Penicillins Other (See Comments)    Reaction in childhood. Does not remember.    HOME MEDICATIONS: Outpatient Prescriptions Prior to Visit  Medication Sig Dispense Refill  . cholecalciferol (VITAMIN D) 1000 UNITS tablet Take 1,000 Units by mouth daily.    Marland Kitchen gabapentin (NEURONTIN) 300 MG capsule Take 1 capsule (300 mg total) by mouth 3 (three) times daily. 270 capsule 3  . Glatiramer Acetate (COPAXONE) 40 MG/ML SOSY Inject 40 mg into the skin 3 (three) times a week. 12 Syringe 6  . Multiple Vitamin (MULTIVITAMIN WITH MINERALS) TABS Take 1 tablet by mouth daily.    . Olopatadine HCl (PATANASE) 0.6 % SOLN Place 1 each into the nose at bedtime.    . vitamin B-12 (CYANOCOBALAMIN) 1000 MCG tablet Take 1,000 mcg by mouth daily.  No facility-administered medications prior to visit.    PAST MEDICAL HISTORY: Past Medical History  Diagnosis Date  . Nephrolithiasis   . Neuromuscular disorder     MS  . Bilateral inguinal hernia   . Multiple sclerosis     PAST SURGICAL HISTORY: Past Surgical History  Procedure Laterality Date  . Vasectomy    . Hernia repair  08/27/12    bilateral repair with mesh  . Squamous cell carcinoma excision        From the forehead    FAMILY HISTORY: Family History  Problem Relation Age of Onset  . Colon cancer    . Heart attack Sister     SOCIAL HISTORY: History   Social History  . Marital Status: Married    Spouse Name: N/A  . Number of Children: 3  . Years of Education: College   Occupational History  .     Social History Main Topics  . Smoking status: Never Smoker   . Smokeless tobacco: Never Used  . Alcohol Use: 0.0 oz/week    0 Standard drinks or equivalent per week     Comment: OCC  . Drug Use: No  . Sexual Activity: Not on file   Other Topics Concern  . Not on file   Social History Narrative   Patient lives at home with his wife Lattie Haw) and two daughters.   Patient works full time.   Education college.   Right handed.   Caffeine three sodas daily.      PHYSICAL EXAM  Filed Vitals:   04/06/15 0829  BP: 126/90  Pulse: 72  Height: 5\' 5"  (1.651 m)  Weight: 141 lb (63.957 kg)   Body mass index is 23.46 kg/(m^2).  Generalized: Well developed, in no acute distress   Neurological examination  Mentation: Alert oriented to time, place, history taking. Follows all commands speech and language fluent Cranial nerve II-XII: Pupils were equal round reactive to light. Extraocular movements were full, visual field were full on confrontational test. Facial sensation and strength were normal. Uvula tongue midline. Head turning and shoulder shrug  were normal and symmetric. Motor: The motor testing reveals 5 over 5 strength of all 4 extremities. Good symmetric motor tone is noted throughout.  Sensory: Sensory testing is intact to soft touch on all 4 extremities. No evidence of extinction is noted.  Coordination: Cerebellar testing reveals good finger-nose-finger and heel-to-shin bilaterally.  Gait and station: Gait is normal. Tandem gait is normal. Romberg is negative. No drift is seen.  Reflexes: Deep tendon reflexes are symmetric and normal bilaterally.   DIAGNOSTIC  DATA (LABS, IMAGING, TESTING) - I reviewed patient records, labs, notes, testing and imaging myself where available.  Lab Results  Component Value Date   WBC 6.6 10/06/2014   HGB 14.2 10/06/2014   HCT 40.6 10/06/2014   MCV 85 10/06/2014   PLT 238 10/06/2014      Component Value Date/Time   NA 140 10/06/2014 1358   NA 138 05/27/2007 1405   K 4.5 10/06/2014 1358   CL 102 10/06/2014 1358   CO2 23 10/06/2014 1358   GLUCOSE 103* 10/06/2014 1358   GLUCOSE 85 05/27/2007 1405   BUN 20 10/06/2014 1358   BUN 15 05/27/2007 1405   CREATININE 1.19 10/06/2014 1358   CALCIUM 9.2 10/06/2014 1358   PROT 6.9 10/06/2014 1358   PROT 7.6 05/27/2007 1405   ALBUMIN 4.2 05/27/2007 1405   AST 13 10/06/2014 1358   ALT 9 10/06/2014 1358  ALKPHOS 72 10/06/2014 1358   BILITOT 0.3 10/06/2014 1358   GFRNONAA 72 10/06/2014 1358   GFRAA 84 10/06/2014 1358     ASSESSMENT AND PLAN 49 y.o. year old male  has a past medical history of Nephrolithiasis; Neuromuscular disorder; Bilateral inguinal hernia; and Multiple sclerosis. here with:  1. Multiple sclerosis  Overall the patient is doing well. He will continue on Copaxone. The patient's last MRI was in 2013. An MRI of the brain was ordered at the last visit- the patient has not had this done yet. He states he will call our office when he is ready to schedule this. Patient had blood work at the last visit that was unremarkable. I have advised patient that if his symptoms worsen or he develops new symptoms he should let us know. Otherwise he will follow-up in 6 months or sooner if needed.  I spent 15 minutes with the patient. 50% of this time was spent counseling the patient on treatment of multiple sclerosis.  Ward Givens, MSN, NP-C 04/06/2015, 8:40 AM [facility] [address] [place], [place] [address] [phone]  Note: This document was prepared with digital dictation and possible smart phrase technology. Any  transcriptional errors that result from this process are unintentional.

## 2015-04-06 NOTE — Patient Instructions (Signed)
Call office when you are ready to have MRI.  We will get lab work at the next visit If your symptoms worsen or you develop new symptoms please let us know.

## 2015-09-16 ENCOUNTER — Other Ambulatory Visit: Payer: Self-pay | Admitting: Neurology

## 2015-10-11 ENCOUNTER — Encounter: Payer: Self-pay | Admitting: Adult Health

## 2015-10-11 ENCOUNTER — Ambulatory Visit (INDEPENDENT_AMBULATORY_CARE_PROVIDER_SITE_OTHER): Payer: Managed Care, Other (non HMO) | Admitting: Adult Health

## 2015-10-11 ENCOUNTER — Other Ambulatory Visit: Payer: Self-pay | Admitting: Adult Health

## 2015-10-11 VITALS — BP 132/84 | HR 69 | Ht 65.0 in | Wt 141.5 lb

## 2015-10-11 DIAGNOSIS — G35 Multiple sclerosis: Secondary | ICD-10-CM | POA: Diagnosis not present

## 2015-10-11 DIAGNOSIS — Z5181 Encounter for therapeutic drug level monitoring: Secondary | ICD-10-CM

## 2015-10-11 MED ORDER — GABAPENTIN 300 MG PO CAPS: 300.0000 mg | ORAL_CAPSULE | Freq: Three times a day (TID) | ORAL | Status: DC

## 2015-10-11 MED ORDER — GLATIRAMER ACETATE 40 MG/ML ~~LOC~~ SOSY
PREFILLED_SYRINGE | SUBCUTANEOUS | Status: DC
Start: 2015-10-11 — End: 2016-03-09

## 2015-10-11 NOTE — Progress Notes (Signed)
I have read the note, and I agree with the clinical assessment and plan.  [name] [name],[name] [name]   

## 2015-10-11 NOTE — Patient Instructions (Signed)
Continue Copaxone MRI of brain Blood work today If your symptoms worsen or you develop new symptoms please let us know.

## 2015-10-11 NOTE — Progress Notes (Signed)
PATIENT: [name] [name] [name] DOB: [birth year]  REASON FOR VISIT: follow up- Multiple sclerosis HISTORY FROM: patient  HISTORY OF PRESENT ILLNESS: Mr. [name] [name] [name] the 50 year old male with a history of multiple sclerosis. He returns today for follow-up. He is currently taking Copaxone and tolerates it well. Patient reports that he has noticed some increased numbness in the fingertips and possible decreased grip bilaterally. He denies any additional weakness. Denies any changes with his gait or balance. Denies any changes with the bowels or bladder. He continues to use gabapentin 3 times a day. He states that he uses this for the sensations he gets in his hands as well as restlessness. He also reports that it does help him sleep at night. He denies any new neurological symptoms. He returns today for an evaluation.  HISTORY 04/06/15: Mr. [name] [name] is a 50 year old male with a history of multiple sclerosis. He returns today for follow-up. He is currently taking Copaxone and tolerating it well. The patient denies any new numbness or weakness. Denies any changes in his gait or balance. No changes in his bowels or bladder. He does state that he's noticed that he's had more difficulty with urinating. He states that he feels that he has to go but it takes him a while to urinate. Denies urinary frequency or urgency. No changes in his vision. The patient does state that he just returned home for [place]. He states while there it was very hot outside and they were doing a lot of walking. He states that he's felt a little more fatigued after the trip but is slowly getting back to normal. He denies any new medical issues. Returns today for an evaluation.  HISTORY 10/06/14: Mr. [name] [name] [name] is a 50 year old male with a history of multiple sclerosis. He returns today for follow-up. He is currently taking Copaxone and tolerating the medication well. The patient denies any new numbness or weakness. Denies any changes in his balance  or gait. No changes in his bowels or bladder. Denies any vision changes. The patient continues to take gabapentin for discomfort in the feet. He states that this is working well. No new medical issues since last seen.  HISTORY 04/05/14 (WILLIS): Mr. [name] [name] is a 50 year old right-handed white male with a history of multiple sclerosis. The patient has been on Copaxone, and he is tolerating the medication well. He last had MRI evaluation the brain in February 2013 which was stable from the prior study in 2008. He denies any injection site reactions or other side effects on the Copaxone. He reports no new issues with numbness, weakness, balance problems, visual field changes, or bowel or bladder control problems. he does have some burning sensations in the feet, and he takes gabapentin for this. This helps him sleep. The patient actually takes only 300 mg twice daily, not 3 times daily. He is on vitamin B12 supplementation, but he does not currently take vitamin D. He returns for an evaluation. The patient indicates that he gets annual ophthalmologic evaluations through Dr. Herbert Deaner. The patient does report some irritability that is a new issue for him.    REVIEW OF SYSTEMS: Out of a complete 14 system review of symptoms, the patient complains only of the following symptoms, and all other reviewed systems are negative.  Environmental allergies, difficulty urinating, runny nose, numbness, weakness, moles  ALLERGIES: Allergies  Allergen Reactions  . Penicillins Other (See Comments)    Reaction in childhood. Does not remember.    HOME MEDICATIONS: Outpatient Prescriptions Prior  to Visit  Medication Sig Dispense Refill  . cholecalciferol (VITAMIN D) 1000 UNITS tablet Take 1,000 Units by mouth daily.    Marland Kitchen COPAXONE 40 MG/ML SOSY INJECT 40 MG INTO THE SKIN THREE TIMES A WEEK. 12 Syringe 3  . gabapentin (NEURONTIN) 300 MG capsule Take 1 capsule (300 mg total) by mouth 3 (three) times daily. 270 capsule 3  .  Multiple Vitamin (MULTIVITAMIN WITH MINERALS) TABS Take 1 tablet by mouth daily.    . Olopatadine HCl (PATANASE) 0.6 % SOLN Place 1 each into the nose at bedtime.    . vitamin B-12 (CYANOCOBALAMIN) 1000 MCG tablet Take 1,000 mcg by mouth daily.     No facility-administered medications prior to visit.    PAST MEDICAL HISTORY: Past Medical History  Diagnosis Date  . Nephrolithiasis   . Neuromuscular disorder ([place])     MS  . Bilateral inguinal hernia   . Multiple sclerosis ([place])     PAST SURGICAL HISTORY: Past Surgical History  Procedure Laterality Date  . Vasectomy    . Hernia repair  08/27/12    bilateral repair with mesh  . Squamous cell carcinoma excision      From the forehead    FAMILY HISTORY: Family History  Problem Relation Age of Onset  . Colon cancer    . Heart attack Sister     SOCIAL HISTORY: Social History   Social History  . Marital Status: Married    Spouse Name: N/A  . Number of Children: 3  . Years of Education: College   Occupational History  .     Social History Main Topics  . Smoking status: Never Smoker   . Smokeless tobacco: Never Used  . Alcohol Use: 0.0 oz/week    0 Standard drinks or equivalent per week     Comment: OCC  . Drug Use: No  . Sexual Activity: Not on file   Other Topics Concern  . Not on file   Social History Narrative   Patient lives at home with his wife Lattie Haw) and two daughters.   Patient works full time.   Education college.   Right handed.   Caffeine three sodas daily.      PHYSICAL EXAM  Filed Vitals:   10/11/15 0812  BP: 132/84  Pulse: 69  Height: 5\' 5"  (1.651 m)  Weight: 141 lb 8 oz (64.184 kg)   Body mass index is 23.55 kg/(m^2).  Generalized: Well developed, in no acute distress   Neurological examination  Mentation: Alert oriented to time, place, history taking. Follows all commands speech and language fluent Cranial nerve II-XII: Pupils were equal round reactive to light. Extraocular  movements were full, visual field were full on confrontational test. Facial sensation and strength were normal. Uvula tongue midline. Head turning and shoulder shrug  were normal and symmetric. Motor: The motor testing reveals 5 over 5 strength of all 4 extremities. Good symmetric motor tone is noted throughout.  Sensory: Sensory testing is intact to soft touch on all 4 extremities. No evidence of extinction is noted.  Coordination: Cerebellar testing reveals good finger-nose-finger and heel-to-shin bilaterally.  Gait and station: Gait is normal. Tandem gait is normal. Romberg is negative. No drift is seen.  Reflexes: Deep tendon reflexes are symmetric and normal bilaterally.   DIAGNOSTIC DATA (LABS, IMAGING, TESTING) - I reviewed patient records, labs, notes, testing and imaging myself where available.  Lab Results  Component Value Date   WBC 6.6 10/06/2014   HGB 14.2 10/06/2014  HCT 40.6 10/06/2014   MCV 85 10/06/2014   PLT 238 10/06/2014      Component Value Date/Time   NA 140 10/06/2014 1358   NA 138 05/27/2007 1405   K 4.5 10/06/2014 1358   CL 102 10/06/2014 1358   CO2 23 10/06/2014 1358   GLUCOSE 103* 10/06/2014 1358   GLUCOSE 85 05/27/2007 1405   BUN 20 10/06/2014 1358   BUN 15 05/27/2007 1405   CREATININE 1.19 10/06/2014 1358   CALCIUM 9.2 10/06/2014 1358   PROT 6.9 10/06/2014 1358   PROT 7.6 05/27/2007 1405   ALBUMIN 4.4 10/06/2014 1358   ALBUMIN 4.2 05/27/2007 1405   AST 13 10/06/2014 1358   ALT 9 10/06/2014 1358   ALKPHOS 72 10/06/2014 1358   BILITOT 0.3 10/06/2014 1358   GFRNONAA 72 10/06/2014 1358   GFRAA 84 10/06/2014 1358        ASSESSMENT AND PLAN 50 y.o. year old male  has a past medical history of Nephrolithiasis; Neuromuscular disorder ([place]); Bilateral inguinal hernia; and Multiple sclerosis ([place]). here with:  1. Multiple sclerosis  Overall the patient is doing well. He will continue on Copaxone. At the last visit MRI was ordered however the  patient has not completed this. He is interested in having this done. We will get this set up for him. We will check blood work today. Patient advised that if his symptoms worsen or he develops new symptoms he should let us know.     Ward Givens, MSN, NP-C 10/11/2015, 8:43 AM [facility] [address], [place], [place] [address] [phone]

## 2015-10-12 ENCOUNTER — Telehealth: Payer: Self-pay

## 2015-10-12 LAB — CBC WITH DIFFERENTIAL/PLATELET
Basophils Absolute: 0.1 x10E3/uL (ref 0.0–0.2)
Basos: 1 %
EOS (ABSOLUTE): 0.3 x10E3/uL (ref 0.0–0.4)
Eos: 4 %
Hematocrit: 43 % (ref 37.5–51.0)
Hemoglobin: 14.7 g/dL (ref 12.6–17.7)
Immature Grans (Abs): 0 x10E3/uL (ref 0.0–0.1)
Immature Granulocytes: 0 %
Lymphocytes Absolute: 2.2 x10E3/uL (ref 0.7–3.1)
Lymphs: 31 %
MCH: 29.7 pg (ref 26.6–33.0)
MCHC: 34.2 g/dL (ref 31.5–35.7)
MCV: 87 fL (ref 79–97)
Monocytes Absolute: 0.7 x10E3/uL (ref 0.1–0.9)
Monocytes: 10 %
Neutrophils Absolute: 3.9 x10E3/uL (ref 1.4–7.0)
Neutrophils: 54 %
Platelets: 249 x10E3/uL (ref 150–379)
RBC: 4.95 x10E6/uL (ref 4.14–5.80)
RDW: 13.4 % (ref 12.3–15.4)
WBC: 7.2 x10E3/uL (ref 3.4–10.8)

## 2015-10-12 LAB — COMPREHENSIVE METABOLIC PANEL WITH GFR
ALT: 10 IU/L (ref 0–44)
AST: 14 IU/L (ref 0–40)
Albumin/Globulin Ratio: 1.8 (ref 1.1–2.5)
Albumin: 4.6 g/dL (ref 3.5–5.5)
Alkaline Phosphatase: 87 IU/L (ref 39–117)
BUN/Creatinine Ratio: 14 (ref 9–20)
BUN: 22 mg/dL (ref 6–24)
Bilirubin Total: 0.4 mg/dL (ref 0.0–1.2)
CO2: 26 mmol/L (ref 18–29)
Calcium: 9.4 mg/dL (ref 8.7–10.2)
Chloride: 100 mmol/L (ref 96–106)
Creatinine, Ser: 1.53 mg/dL — ABNORMAL HIGH (ref 0.76–1.27)
GFR calc Af Amer: 61 mL/min/1.73
GFR calc non Af Amer: 53 mL/min/1.73 — ABNORMAL LOW
Globulin, Total: 2.6 g/dL (ref 1.5–4.5)
Glucose: 97 mg/dL (ref 65–99)
Potassium: 5.3 mmol/L — ABNORMAL HIGH (ref 3.5–5.2)
Sodium: 140 mmol/L (ref 134–144)
Total Protein: 7.2 g/dL (ref 6.0–8.5)

## 2015-10-12 NOTE — Telephone Encounter (Signed)
-----   Message from Ward Givens, NP sent at 10/12/2015  7:44 AM EST ----- Lab work ok. Creatinine is slightly elevated and GFR slightly low. We will continue to monitor. Please fax lab work to PCP. Call patient.

## 2015-10-12 NOTE — Telephone Encounter (Signed)
Called to discuss pt's lab results. No answer, left a message asking him to call me back.

## 2015-10-13 NOTE — Telephone Encounter (Signed)
-----   Message from Ward Givens, NP sent at 10/12/2015  7:44 AM EST ----- Lab work ok. Creatinine is slightly elevated and GFR slightly low. We will continue to monitor. Please fax lab work to PCP. Call patient.

## 2015-10-13 NOTE — Telephone Encounter (Signed)
Spoke to [name] [name] and advised him that his creatinine is slightly elevated and GFR slightly low and that we will continue to monitor it. [name] [name] says that he knows he has not been drinking enough water lately and will do better. I advised him that I will fax his labs to Dr. Maylon Peppers. [name] [name] verbalized understanding.

## 2015-12-21 ENCOUNTER — Ambulatory Visit (INDEPENDENT_AMBULATORY_CARE_PROVIDER_SITE_OTHER): Payer: Managed Care, Other (non HMO)

## 2015-12-21 DIAGNOSIS — G35 Multiple sclerosis: Secondary | ICD-10-CM

## 2015-12-21 MED ORDER — GADOPENTETATE DIMEGLUMINE 469.01 MG/ML IV SOLN: 14.0000 mL | Freq: Once | INTRAVENOUS | Status: AC | PRN

## 2015-12-23 ENCOUNTER — Telehealth: Payer: Self-pay | Admitting: Adult Health

## 2015-12-23 NOTE — Telephone Encounter (Signed)
I called the patient and left a message forhim to call our office.,please let him know that his MRI did not show any new lesions. It was unchanged from his previous MRI.

## 2015-12-26 NOTE — Telephone Encounter (Signed)
Pt returned Megan's call. Message relayed, pt understood and did not have any questions.

## 2016-03-01 ENCOUNTER — Ambulatory Visit (INDEPENDENT_AMBULATORY_CARE_PROVIDER_SITE_OTHER): Payer: Managed Care, Other (non HMO) | Admitting: Adult Health

## 2016-03-01 ENCOUNTER — Encounter: Payer: Self-pay | Admitting: Adult Health

## 2016-03-01 VITALS — BP 140/93 | HR 70 | Ht 65.0 in | Wt 141.8 lb

## 2016-03-01 DIAGNOSIS — G35 Multiple sclerosis: Secondary | ICD-10-CM

## 2016-03-01 DIAGNOSIS — R252 Cramp and spasm: Secondary | ICD-10-CM

## 2016-03-01 DIAGNOSIS — R202 Paresthesia of skin: Secondary | ICD-10-CM | POA: Diagnosis not present

## 2016-03-01 NOTE — Progress Notes (Signed)
I have read the note, and I agree with the clinical assessment and plan.  Markeem Noreen KEITH   

## 2016-03-01 NOTE — Progress Notes (Signed)
PATIENT: [name] [name] DOB: 30-Jun-1966  REASON FOR VISIT: follow up- multiple sclerosis HISTORY FROM: patient  HISTORY OF PRESENT ILLNESS: [name] [name] is a 50 year old male with a history of multiple sclerosis. He returns today for follow-up. He is taking Copaxone and tolerating it well. He reports that 2 months ago he noticed that his hands felt like they were swollen. He states especially the tip of his fingertips. He reports numbness as well. He states that this sensation has continued. He denies any numbness in the lower extremities. Denies any weakness in the upper or lower extremities. He states that  last Friday after his dose of Copaxone he began to have a flushing sensation followed by cramping and contracting of the hands and arms. He states that it occurred in both arms. He states within an hour he returned to his baseline. The next day he felt fine. 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 Again the episode was very brief. He states that he does feel like the tingling that was located in just his hands has now traveled up his arms. He denies any changes with the bowels or bladder. He states that when he goes to urinate he does "stand there while" before he is able to go. He denies any changes with his vision. Denies any changes with his walking or balance. He reports no change in his strength. He returns today for an evaluation.  MRI brain with and without contrast 12/22/2015: Compared to MRI on 05/27/2007 no new plaques are seen.  HISTORY 10/11/15: [name] [name] the 50 year old male with a history of multiple sclerosis. He returns today for follow-up. He is currently taking Copaxone and tolerates it well. Patient reports that he has noticed some increased numbness in the fingertips and possible decreased grip bilaterally. He denies any additional weakness. Denies any changes with his gait or balance. Denies any changes with the bowels  or bladder. He continues to use gabapentin 3 times a day. He states that he uses this for the sensations he gets in his hands as well as restlessness. He also reports that it does help him sleep at night. He denies any new neurological symptoms. He returns today for an evaluation.  HISTORY 04/06/2015: [name] [name] is a 50 year old male with a history of multiple sclerosis. He returns today for follow-up. He is currently taking Copaxone and tolerating it well. The patient denies any new numbness or weakness. Denies any changes in his gait or balance. No changes in his bowels or bladder. He does state that he's noticed that he's had more difficulty with urinating. He states that he feels that he has to go but it takes him a while to urinate. Denies urinary frequency or urgency. No changes in his vision. The patient does state that he just returned home for [place]. He states while there it was very hot outside and they were doing a lot of walking. He states that he's felt a little more fatigued after the trip but is slowly getting back to normal. He denies any new medical issues. Returns today for an evaluation.  HISTORY 10/06/14: [name] [name] is a 50 year old male with a history of multiple sclerosis. He returns today for follow-up. He is currently taking Copaxone and tolerating the medication well. The patient denies any new numbness or weakness. Denies any changes in his balance or gait. No changes in his bowels or bladder. Denies any vision changes.  The patient continues to take gabapentin for discomfort in the feet. He states that this is working well. No new medical issues since last seen.  HISTORY 04/05/14 (WILLIS): [name] [name] is a 50 year old right-handed white male with a history of multiple sclerosis. The patient has been on Copaxone, and he is tolerating the medication well. He last had MRI evaluation the brain in February 2013 which was stable from the prior study in 2008. He denies any injection site  reactions or other side effects on the Copaxone. He reports no new issues with numbness, weakness, balance problems, visual field changes, or bowel or bladder control problems. he does have some burning sensations in the feet, and he takes gabapentin for this. This helps him sleep. The patient actually takes only 300 mg twice daily, not 3 times daily. He is on vitamin B12 supplementation, but he does not currently take vitamin D. He returns for an evaluation. The patient indicates that he gets annual ophthalmologic evaluations through Dr. Herbert Deaner. The patient does report some irritability that is a new issue for him.  REVIEW OF SYSTEMS: Out of a complete 14 system review of symptoms, the patient complains only of the following symptoms, and all other reviewed systems are negative.  Difficulty urinating, muscle cramps, moles, snoring, tremors, numbness, environmental allergies  ALLERGIES: Allergies  Allergen Reactions  . Penicillins Other (See Comments)    Reaction in childhood. Does not remember.    HOME MEDICATIONS: Outpatient Prescriptions Prior to Visit  Medication Sig Dispense Refill  . cholecalciferol (VITAMIN D) 1000 UNITS tablet Take 1,000 Units by mouth daily.    Marland Kitchen gabapentin (NEURONTIN) 300 MG capsule Take 1 capsule (300 mg total) by mouth 3 (three) times daily. 270 capsule 3  . Glatiramer Acetate (COPAXONE) 40 MG/ML SOSY INJECT 40 MG INTO THE SKIN THREE TIMES A WEEK. 12 Syringe 3  . Multiple Vitamin (MULTIVITAMIN WITH MINERALS) TABS Take 1 tablet by mouth daily.    . Olopatadine HCl (PATANASE) 0.6 % SOLN Place 1 each into the nose at bedtime.    . vitamin B-12 (CYANOCOBALAMIN) 1000 MCG tablet Take 1,000 mcg by mouth daily.     Facility-Administered Medications Prior to Visit  Medication Dose Route Frequency Provider Last Rate Last Dose  . gadopentetate dimeglumine (MAGNEVIST) injection 14 mL  14 mL Intravenous Once PRN Kathrynn Ducking, MD        PAST MEDICAL HISTORY: Past  Medical History  Diagnosis Date  . Nephrolithiasis   . Neuromuscular disorder ([place])     MS  . Bilateral inguinal hernia   . Multiple sclerosis ([place])     PAST SURGICAL HISTORY: Past Surgical History  Procedure Laterality Date  . Vasectomy    . Hernia repair  08/27/12    bilateral repair with mesh  . Squamous cell carcinoma excision      From the forehead    FAMILY HISTORY: Family History  Problem Relation Age of Onset  . Colon cancer    . Heart attack Sister     SOCIAL HISTORY: Social History   Social History  . Marital Status: Married    Spouse Name: N/A  . Number of Children: 3  . Years of Education: College   Occupational History  .     Social History Main Topics  . Smoking status: Never Smoker   . Smokeless tobacco: Never Used  . Alcohol Use: 0.0 oz/week    0 Standard drinks or equivalent per week     Comment: OCC  .  Drug Use: No  . Sexual Activity: Not on file   Other Topics Concern  . Not on file   Social History Narrative   Patient lives at home with his wife Lattie Haw) and two daughters.   Patient works full time.   Education college.   Right handed.   Caffeine three sodas daily.      PHYSICAL EXAM  Filed Vitals:   03/01/16 1513  BP: 140/93  Pulse: 70  Height: 5\' 5"  (1.651 m)  Weight: 141 lb 12.8 oz (64.32 kg)   Body mass index is 23.6 kg/(m^2).  Generalized: Well developed, in no acute distress   Neurological examination  Mentation: Alert oriented to time, place, history taking. Follows all commands speech and language fluent Cranial nerve II-XII: Pupils were equal round reactive to light. Extraocular movements were full, visual field were full on confrontational test. Facial sensation and strength were normal. Uvula tongue midline. Head turning and shoulder shrug  were normal and symmetric. Motor: The motor testing reveals 5 over 5 strength of all 4 extremities. Good symmetric motor tone is noted throughout.  Sensory: Sensory testing is  intact to soft touch on all 4 extremities. No evidence of extinction is noted.  Coordination: Cerebellar testing reveals good finger-nose-finger and heel-to-shin bilaterally.  Gait and station: Gait is normal. Tandem gait is normal. Romberg is negative. No drift is seen.  Reflexes: Deep tendon reflexes are symmetric and normal bilaterally.   DIAGNOSTIC DATA (LABS, IMAGING, TESTING) - I reviewed patient records, labs, notes, testing and imaging myself where available.  Lab Results  Component Value Date   WBC 7.2 10/11/2015   HGB 14.2 10/06/2014   HCT 43.0 10/11/2015   MCV 87 10/11/2015   PLT 249 10/11/2015      Component Value Date/Time   NA 140 10/11/2015 0906   NA 138 05/27/2007 1405   K 5.3* 10/11/2015 0906   CL 100 10/11/2015 0906   CO2 26 10/11/2015 0906   GLUCOSE 97 10/11/2015 0906   GLUCOSE 85 05/27/2007 1405   BUN 22 10/11/2015 0906   BUN 15 05/27/2007 1405   CREATININE 1.53* 10/11/2015 0906   CALCIUM 9.4 10/11/2015 0906   PROT 7.2 10/11/2015 0906   PROT 7.6 05/27/2007 1405   ALBUMIN 4.6 10/11/2015 0906   ALBUMIN 4.2 05/27/2007 1405   AST 14 10/11/2015 0906   ALT 10 10/11/2015 0906   ALKPHOS 87 10/11/2015 0906   BILITOT 0.4 10/11/2015 0906   BILITOT 0.3 10/06/2014 1358   GFRNONAA 53* 10/11/2015 0906   GFRAA 61 10/11/2015 0906      ASSESSMENT AND PLAN 50 y.o. year old male  has a past medical history of Nephrolithiasis; Neuromuscular disorder ([place]); Bilateral inguinal hernia; and Multiple sclerosis ([place]). here with:  1. Multiple sclerosis 2. Muscle cramps 3. Paresthesias hands  The patient is reporting new symptoms that started approximately 2 months ago. He has also had additional episodes this past week that lasted less than an hour. I will repeat an MRI of the brain and cervical spine. I will also check blood work today. Patient advised that if his symptoms worsen or he develops any new symptoms he should let us know. He currently has a follow-up in July  with Dr. Jannifer Franklin however pending his MRI this appointment may be adjusted.   Ward Givens, MSN, NP-C 03/01/2016, 3:31 PM [facility] [address], [place], [place] [address] [phone]

## 2016-03-02 ENCOUNTER — Telehealth: Payer: Self-pay | Admitting: Adult Health

## 2016-03-02 LAB — CBC WITH DIFFERENTIAL/PLATELET
BASOS ABS: 0.1 10*3/uL (ref 0.0–0.2)
BASOS: 1 %
EOS (ABSOLUTE): 0.4 10*3/uL (ref 0.0–0.4)
Eos: 6 %
HEMOGLOBIN: 14.2 g/dL (ref 12.6–17.7)
Hematocrit: 42 % (ref 37.5–51.0)
IMMATURE GRANS (ABS): 0 10*3/uL (ref 0.0–0.1)
Immature Granulocytes: 0 %
LYMPHS ABS: 2.6 10*3/uL (ref 0.7–3.1)
Lymphs: 34 %
MCH: 29.9 pg (ref 26.6–33.0)
MCHC: 33.8 g/dL (ref 31.5–35.7)
MCV: 88 fL (ref 79–97)
MONOCYTES: 11 %
Monocytes Absolute: 0.8 10*3/uL (ref 0.1–0.9)
NEUTROS ABS: 3.6 10*3/uL (ref 1.4–7.0)
NEUTROS PCT: 48 %
Platelets: 242 10*3/uL (ref 150–379)
RBC: 4.75 x10E6/uL (ref 4.14–5.80)
RDW: 13.7 % (ref 12.3–15.4)
WBC: 7.5 10*3/uL (ref 3.4–10.8)

## 2016-03-02 LAB — COMPREHENSIVE METABOLIC PANEL
ALBUMIN: 4.4 g/dL (ref 3.5–5.5)
ALT: 9 IU/L (ref 0–44)
AST: 12 IU/L (ref 0–40)
Albumin/Globulin Ratio: 1.5 (ref 1.2–2.2)
Alkaline Phosphatase: 73 IU/L (ref 39–117)
BUN / CREAT RATIO: 15 (ref 9–20)
BUN: 19 mg/dL (ref 6–24)
Bilirubin Total: 0.3 mg/dL (ref 0.0–1.2)
CO2: 25 mmol/L (ref 18–29)
CREATININE: 1.28 mg/dL — AB (ref 0.76–1.27)
Calcium: 9.5 mg/dL (ref 8.7–10.2)
Chloride: 102 mmol/L (ref 96–106)
GFR, EST AFRICAN AMERICAN: 76 mL/min/{1.73_m2} (ref >59)
GFR, EST NON AFRICAN AMERICAN: 66 mL/min/{1.73_m2} (ref >59)
GLUCOSE: 97 mg/dL (ref 65–99)
Globulin, Total: 2.9 g/dL (ref 1.5–4.5)
POTASSIUM: 5.1 mmol/L (ref 3.5–5.2)
SODIUM: 144 mmol/L (ref 134–144)
Total Protein: 7.3 g/dL (ref 6.0–8.5)

## 2016-03-02 NOTE — Telephone Encounter (Signed)
I called the patient. Lab work is ok. Left a voice message with results.

## 2016-03-09 ENCOUNTER — Other Ambulatory Visit: Payer: Self-pay | Admitting: Adult Health

## 2016-04-10 ENCOUNTER — Ambulatory Visit: Payer: Managed Care, Other (non HMO) | Admitting: Neurology

## 2016-05-27 ENCOUNTER — Ambulatory Visit
Admission: RE | Admit: 2016-05-27 | Discharge: 2016-05-27 | Disposition: A | Discharge status: Home / Self Care | Payer: Managed Care, Other (non HMO) | Source: Ambulatory Visit | Attending: Adult Health | Admitting: Adult Health

## 2016-05-27 DIAGNOSIS — G35 Multiple sclerosis: Secondary | ICD-10-CM | POA: Diagnosis not present

## 2016-05-27 MED ORDER — GADOBENATE DIMEGLUMINE 529 MG/ML IV SOLN
13.0000 mL | Freq: Once | INTRAVENOUS | Status: AC | PRN
  Administered 2016-05-27: 13 mL via INTRAVENOUS

## 2016-05-30 ENCOUNTER — Telehealth: Payer: Self-pay | Admitting: Adult Health

## 2016-05-30 NOTE — Telephone Encounter (Signed)
I called the patient. Left a message for him to call our office.

## 2016-05-30 NOTE — Telephone Encounter (Signed)
Pt returned NP's call. °

## 2016-05-31 NOTE — Telephone Encounter (Signed)
I called the patient. I reviewed his cervical MRI which shows new lesions. The patient has been on Copaxone since diagnosed with multiple sclerosis. We will need to consider another medication such as Gilenya. The patient has an office visit with me on Tuesday, September 11. I reviewed the scans with Dr. Jannifer Franklin and he is in agreement with plan

## 2016-05-31 NOTE — Telephone Encounter (Signed)
I called the patient left a message for him to call our office.

## 2016-06-04 ENCOUNTER — Ambulatory Visit (INDEPENDENT_AMBULATORY_CARE_PROVIDER_SITE_OTHER): Payer: Managed Care, Other (non HMO) | Admitting: Adult Health

## 2016-06-04 ENCOUNTER — Telehealth: Payer: Self-pay | Admitting: Adult Health

## 2016-06-04 ENCOUNTER — Encounter: Payer: Self-pay | Admitting: Adult Health

## 2016-06-04 VITALS — BP 144/93 | HR 77 | Resp 14 | Ht 65.0 in | Wt 140.0 lb

## 2016-06-04 DIAGNOSIS — Z5181 Encounter for therapeutic drug level monitoring: Secondary | ICD-10-CM | POA: Diagnosis not present

## 2016-06-04 DIAGNOSIS — G35 Multiple sclerosis: Secondary | ICD-10-CM | POA: Diagnosis not present

## 2016-06-04 NOTE — Telephone Encounter (Signed)
[name] [name],   Are we faxing information about this patient?

## 2016-06-04 NOTE — Progress Notes (Signed)
I have read the note, and I agree with the clinical assessment and plan.  Keari Miu KEITH   

## 2016-06-04 NOTE — Telephone Encounter (Signed)
Dr Maylon Peppers [phone] called to advise he has been getting faxes on this pt but he said that pt is not followed in this office.  FYI

## 2016-06-04 NOTE — Progress Notes (Signed)
PATIENT: [name] [name] DOB: 24-May-1966  REASON FOR VISIT: follow up HISTORY FROM: patient  HISTORY OF PRESENT ILLNESS: Mr. [name] [name] is a 50 year old male with a history of multiple sclerosis. He returns today to discuss treatment options following his MRI results. The patient is currently on Copaxone. His MRI of the cervical spine did show new lesions when compared to his scan in 2008. The patient continues to have the episodes he described at his visit in June. He states that he will have a sensation and then his hands will typically contract. He states this last for seconds however after this resolves he feels as if his hands are swollen and "tingly." He states this may last for 1-2 hours and then he is back at his baseline. He states that at times the contraction will only occur in one arm and other times both. He states that for while it was occurring daily however this past week he went  5 days without having this occur. He denies any trouble with his vision. Denies any changes with his gait or balance. Denies any new weakness. Denies any trouble with the bowels or bladder. Although he does state that he sometimes have difficulty urinating. He says this is been ongoing. He has not followed up with a primary care regarding this. He returns today for evaluation.  MRI cervical spine with and without contrast 05/28/2016:  IMPRESSION:  This MRI of the cervical spine with and without contrast shows the following: 1.   Multiple T2/FLAIR hyperintense foci within the spinal cord although this most likely represents demyelinating plaque associated with multiple sclerosis, the longitudinal nature of some of the lesions could also be due to neuromyelitis optica or vasculitis.   There has been progression since the MRI dated 05/27/2007 2.   Adjacent to C4-C5, there is patchy posterior enhancement that could represent more acute to subacute change. 3.   There are no significant degenerative  changes   HISTORY 03/01/16: Mr. [name] [name] is a 50 year old male with a history of multiple sclerosis. He returns today for follow-up. He is taking Copaxone and tolerating it well. He reports that 2 months ago he noticed that his hands felt like they were swollen. He states especially the tip of his fingertips. He reports numbness as well. He states that this sensation has continued. He denies any numbness in the lower extremities. Denies any weakness in the upper or lower extremities. He states that  last Friday after his dose of Copaxone he began to have a flushing sensation followed by cramping and contracting of the hands and arms. He states that it occurred in both arms. He states within an hour he returned to his baseline. The next day he felt fine. 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 Again the episode was very brief. He states that he does feel like the tingling that was located in just his hands has now traveled up his arms. He denies any changes with the bowels or bladder. He states that when he goes to urinate he does "stand there while" before he is able to go. He denies any changes with his vision. Denies any changes with his walking or balance. He reports no change in his strength. He returns today for an evaluation.  REVIEW OF SYSTEMS: Out of a complete 14 system review of symptoms, the patient complains only of the following symptoms, and all other reviewed systems are negative.  Difficulty  urinating environmental allergies numbness, tremors, snoring, moles  ALLERGIES: Allergies  Allergen Reactions  . Penicillins Other (See Comments)    Reaction in childhood. Does not remember.    HOME MEDICATIONS: Outpatient Medications Prior to Visit  Medication Sig Dispense Refill  . cholecalciferol (VITAMIN D) 1000 UNITS tablet Take 1,000 Units by mouth daily.    Marland Kitchen COPAXONE 40 MG/ML SOSY INJECT 40 MG UNDER THE SKIN THREE TIMES A WEEK 12 mL  2  . gabapentin (NEURONTIN) 300 MG capsule Take 1 capsule (300 mg total) by mouth 3 (three) times daily. 270 capsule 3  . Multiple Vitamin (MULTIVITAMIN WITH MINERALS) TABS Take 1 tablet by mouth daily.    . Olopatadine HCl (PATANASE) 0.6 % SOLN Place 1 each into the nose at bedtime.    . vitamin B-12 (CYANOCOBALAMIN) 1000 MCG tablet Take 1,000 mcg by mouth daily.     Facility-Administered Medications Prior to Visit  Medication Dose Route Frequency Provider Last Rate Last Dose  . gadopentetate dimeglumine (MAGNEVIST) injection 14 mL  14 mL Intravenous Once PRN Kathrynn Ducking, MD        PAST MEDICAL HISTORY: Past Medical History:  Diagnosis Date  . Bilateral inguinal hernia   . Multiple sclerosis ([place])   . Nephrolithiasis   . Neuromuscular disorder ([place])    MS    PAST SURGICAL HISTORY: Past Surgical History:  Procedure Laterality Date  . HERNIA REPAIR  08/27/12   bilateral repair with mesh  . SQUAMOUS CELL CARCINOMA EXCISION     From the forehead  . VASECTOMY      FAMILY HISTORY: Family History  Problem Relation Age of Onset  . Colon cancer    . Heart attack Sister     SOCIAL HISTORY: Social History   Social History  . Marital status: Married    Spouse name: N/A  . Number of children: 3  . Years of education: College   Occupational History  .  [employer]   Social History Main Topics  . Smoking status: Never Smoker  . Smokeless tobacco: Never Used  . Alcohol use 0.0 oz/week     Comment: OCC  . Drug use: No  . Sexual activity: Not on file   Other Topics Concern  . Not on file   Social History Narrative   Patient lives at home with his wife Lattie Haw) and two daughters.   Patient works full time.   Education college.   Right handed.   Caffeine three sodas daily.      PHYSICAL EXAM  Vitals:   06/04/16 0718  BP: (!) 144/93  Pulse: 77  Resp: 14  Weight: 140 lb (63.5 kg)  Height: 5\' 5"  (1.651 m)   Body mass index is 23.3 kg/m.  Generalized: Well  developed, in no acute distress   Neurological examination  Mentation: Alert oriented to time, place, history taking. Follows all commands speech and language fluent Cranial nerve II-XII: Pupils were equal round reactive to light. Extraocular movements were full, visual field were full on confrontational test. Facial sensation and strength were normal. Uvula tongue midline. Head turning and shoulder shrug  were normal and symmetric. Motor: The motor testing reveals 5 over 5 strength of all 4 extremities. Good symmetric motor tone is noted throughout.  Sensory: Sensory testing is intact to soft touch on all 4 extremities. No evidence of extinction is noted.  Coordination: Cerebellar testing reveals good finger-nose-finger and heel-to-shin bilaterally.  Gait and station: Gait is normal. Tandem gait is normal. Romberg  is negative. No drift is seen.  Reflexes: Deep tendon reflexes are symmetric and normal bilaterally.   DIAGNOSTIC DATA (LABS, IMAGING, TESTING) - I reviewed patient records, labs, notes, testing and imaging myself where available.  Lab Results  Component Value Date   WBC 7.5 03/01/2016   HGB 14.2 10/06/2014   HCT 42.0 03/01/2016   MCV 88 03/01/2016   PLT 242 03/01/2016      Component Value Date/Time   NA 144 03/01/2016 1637   K 5.1 03/01/2016 1637   CL 102 03/01/2016 1637   CO2 25 03/01/2016 1637   GLUCOSE 97 03/01/2016 1637   GLUCOSE 85 05/27/2007 1405   BUN 19 03/01/2016 1637   CREATININE 1.28 (H) 03/01/2016 1637   CALCIUM 9.5 03/01/2016 1637   PROT 7.3 03/01/2016 1637   ALBUMIN 4.4 03/01/2016 1637   AST 12 03/01/2016 1637   ALT 9 03/01/2016 1637   ALKPHOS 73 03/01/2016 1637   BILITOT 0.3 03/01/2016 1637   GFRNONAA 66 03/01/2016 1637   GFRAA 76 03/01/2016 1637   Lab Results  Component Value Date   CHOL  05/27/2007    189        ATP III CLASSIFICATION:  <200     mg/dL   Desirable  200-239  mg/dL   Borderline High  >=240    mg/dL   High   HDL 27 (L)  05/27/2007   LDLCALC (H) 05/27/2007    115        Total Cholesterol/HDL:CHD Risk Coronary Heart Disease Risk Table                     Men   Women  1/2 Average Risk   3.4   3.3   TRIG 237 (H) 05/27/2007   CHOLHDL 7.0 05/27/2007   Lab Results  Component Value Date   HGBA1C  05/27/2007    5.8 (NOTE)   The ADA recommends the following therapeutic goals for glycemic   control related to Hgb A1C measurement:   Goal of Therapy:   < 7.0% Hgb A1C   Action Suggested:  > 8.0% Hgb A1C   Ref:  Diabetes Care, 29, Suppl. 1, 1999     ASSESSMENT AND PLAN 50 y.o. year old male  has a past medical history of Bilateral inguinal hernia; Multiple sclerosis ([place]); Nephrolithiasis; and Neuromuscular disorder ([place]). here with:  1. Multiple sclerosis  I discussed the patient's MRI scan with Dr. Jannifer Franklin last week. The patient will be switched to a another disease modifying agent. I discussed Gilenya with the patient and he is amenable to switching. I will check blood work today. He will sign paperwork in order to start [place]. He will stop Copaxone. He will follow-up in 3 months with Dr. Jannifer Franklin or sooner if needed.  I spent 25 minutes with the patient. 50% of this time spent counseling the patient on treatment options and reviewing his MRI scans.     Ward Givens, MSN, NP-C 06/04/2016, 7:28 AM [facility] [address] [place], [place] [address] [phone]

## 2016-06-04 NOTE — Patient Instructions (Signed)
Stop Copaxone Going to start [place] If your symptoms worsen or you develop new symptoms please let us know.

## 2016-06-05 ENCOUNTER — Telehealth: Payer: Self-pay

## 2016-06-05 LAB — COMPREHENSIVE METABOLIC PANEL
ALK PHOS: 79 IU/L (ref 39–117)
ALT: 17 IU/L (ref 0–44)
AST: 12 IU/L (ref 0–40)
Albumin/Globulin Ratio: 1.8 (ref 1.2–2.2)
Albumin: 4.5 g/dL (ref 3.5–5.5)
BUN/Creatinine Ratio: 14 (ref 9–20)
BUN: 17 mg/dL (ref 6–24)
Bilirubin Total: 0.3 mg/dL (ref 0.0–1.2)
CALCIUM: 9.6 mg/dL (ref 8.7–10.2)
CO2: 27 mmol/L (ref 18–29)
CREATININE: 1.25 mg/dL (ref 0.76–1.27)
Chloride: 104 mmol/L (ref 96–106)
GFR calc Af Amer: 78 mL/min/{1.73_m2} (ref >59)
GFR, EST NON AFRICAN AMERICAN: 68 mL/min/{1.73_m2} (ref >59)
GLOBULIN, TOTAL: 2.5 g/dL (ref 1.5–4.5)
GLUCOSE: 103 mg/dL — AB (ref 65–99)
Potassium: 5.6 mmol/L — ABNORMAL HIGH (ref 3.5–5.2)
SODIUM: 145 mmol/L — AB (ref 134–144)
Total Protein: 7 g/dL (ref 6.0–8.5)

## 2016-06-05 LAB — CBC WITH DIFFERENTIAL/PLATELET
BASOS: 1 %
Basophils Absolute: 0.1 10*3/uL (ref 0.0–0.2)
EOS (ABSOLUTE): 0.4 10*3/uL (ref 0.0–0.4)
EOS: 6 %
HEMATOCRIT: 41.3 % (ref 37.5–51.0)
HEMOGLOBIN: 14.2 g/dL (ref 12.6–17.7)
IMMATURE GRANS (ABS): 0 10*3/uL (ref 0.0–0.1)
IMMATURE GRANULOCYTES: 0 %
LYMPHS: 35 %
Lymphocytes Absolute: 2.1 10*3/uL (ref 0.7–3.1)
MCH: 29.7 pg (ref 26.6–33.0)
MCHC: 34.4 g/dL (ref 31.5–35.7)
MCV: 86 fL (ref 79–97)
MONOCYTES: 11 %
Monocytes Absolute: 0.7 10*3/uL (ref 0.1–0.9)
NEUTROS PCT: 47 %
Neutrophils Absolute: 2.8 10*3/uL (ref 1.4–7.0)
Platelets: 193 10*3/uL (ref 150–379)
RBC: 4.78 x10E6/uL (ref 4.14–5.80)
RDW: 13 % (ref 12.3–15.4)
WBC: 6.1 10*3/uL (ref 3.4–10.8)

## 2016-06-05 LAB — HEPATIC FUNCTION PANEL: Bilirubin, Direct: 0.1 mg/dL (ref 0.00–0.40)

## 2016-06-05 LAB — VARICELLA ZOSTER ANTIBODY, IGG: VARICELLA: 302 {index} (ref >165)

## 2016-06-05 NOTE — Telephone Encounter (Signed)
-----   Message from Ward Givens, NP sent at 06/05/2016  9:08 AM EDT ----- Lab work ok. Potassium is slightly elevated- monitor diet- limit food high in potassium. Still waiting for jcv

## 2016-06-05 NOTE — Telephone Encounter (Signed)
I spoke to pt and advised him that his lab work is ok but his potassium was elevated and to monitor his diet and limit potassium rich food. Pt says that he has been eating a lot of nuts lately.  Pt reports that he has an eye doctor appt on Friday in preparation for his Gilenya start. I advised him that we are still waiting on his JCV result, but will call him when that becomes available.  Pt verbalized understanding of results. Pt had no questions at this time but was encouraged to call back if questions arise.

## 2016-06-07 ENCOUNTER — Other Ambulatory Visit: Payer: Self-pay | Admitting: *Deleted

## 2016-06-07 DIAGNOSIS — G35 Multiple sclerosis: Secondary | ICD-10-CM

## 2016-06-11 ENCOUNTER — Telehealth: Payer: Self-pay | Admitting: *Deleted

## 2016-06-11 NOTE — Telephone Encounter (Signed)
Spoke to pt and he had eye exam and they are to fax [place] records.  I told him that [place] [facility] will call him to set up CV consult and then [facility] after that.  He knows not to take medication until that time.  He has stopped copaxone per MM/NP.  He verbalized understanding.

## 2016-06-11 NOTE — Telephone Encounter (Signed)
I called and LMVM for pt to return call re: gilenya.  (had eye exam Friday).  JCV  Negative Index 0.17.

## 2016-06-11 NOTE — Telephone Encounter (Signed)
Pt returned RN's call °

## 2016-06-14 NOTE — Telephone Encounter (Signed)
Received notice that gilenya not covered (part of 2 step program).  Pt has bee on copaxone previously (glatopa).  Will need to call [facility].

## 2016-06-14 NOTE — Telephone Encounter (Signed)
Received approval for gilenya 05-15-16 thru 06-14-19.  [facility]  [phone]   Case ID# [id number].

## 2016-06-21 NOTE — Telephone Encounter (Signed)
I received call from [place] from [place].  Dr. Einar Gip office had not received referral.   I refaxed on 06-14-16.  I called today and pt had consult on 06-12-16 and was there today since 0900 having FDO.  I had her fax note for consult to [place].

## 2016-06-25 ENCOUNTER — Telehealth: Payer: Self-pay | Admitting: *Deleted

## 2016-06-25 NOTE — Telephone Encounter (Signed)
I called and spoke to Dr. Irven Shelling office Thursday last,  to ask about pts referral and was informed that he had consult on 06-12-16 and was having the [facility] 06-21-16 as we were speaking.   I informed MM/NP and will get eye exam results as she had not seen them and I did not see in epic.  I LMVM for pt to return call. (see who did eye exam) so I can get results again.

## 2016-06-25 NOTE — Telephone Encounter (Signed)
Iv'e called and LMVM for pt to let me know who his eye doctor is to get that exam note.  His last note for Gilenya FDO came today and that it in your office.

## 2016-06-25 NOTE — Telephone Encounter (Signed)
Sandi do I need to do anything else?

## 2016-06-26 ENCOUNTER — Telehealth: Payer: Self-pay | Admitting: Adult Health

## 2016-06-26 NOTE — Telephone Encounter (Signed)
[place] [phone] called inquiring if pt has had 1st dose for observation for gilenya  Please call

## 2016-06-26 NOTE — Telephone Encounter (Signed)
IF patient calls back give him the below message, If im not available.The information below is what I need.   LFt vm for patient to call back about gilenya. Rn needs to know if he has started taking his gilenya and on what date.

## 2016-06-26 NOTE — Telephone Encounter (Signed)
I called and LMVM for pt that calling again about gilenya and eye exam that he had done prior to [facility].

## 2016-06-26 NOTE — Telephone Encounter (Signed)
Patient returned Katrina's call, relayed message below. Patient states he started Gilenya last Thursday and has been taking everyday since.

## 2016-06-26 NOTE — Telephone Encounter (Signed)
Rn call [name] [name] at [employer] at [employer]. Rn ask for [facility] but the call did not know what dept call [facility]. Rn explain pt is on Gilenya and the previous phone call stated they wanted to know when did pt start first dosage. [name] [name] stated there is no documentation of the patient getting gilenya. She did pull up the pts profile but I was blank. Rn stated they were just returning the phone call from [place]. Rn stated the call was being return.

## 2016-06-27 ENCOUNTER — Ambulatory Visit (INDEPENDENT_AMBULATORY_CARE_PROVIDER_SITE_OTHER): Payer: Managed Care, Other (non HMO) | Admitting: Family Medicine

## 2016-06-27 ENCOUNTER — Encounter: Payer: Self-pay | Admitting: Family Medicine

## 2016-06-27 VITALS — BP 146/92 | HR 67 | Temp 97.6°F | Ht 65.0 in | Wt 140.2 lb

## 2016-06-27 DIAGNOSIS — R030 Elevated blood-pressure reading, without diagnosis of hypertension: Secondary | ICD-10-CM

## 2016-06-27 DIAGNOSIS — E781 Pure hyperglyceridemia: Secondary | ICD-10-CM

## 2016-06-27 DIAGNOSIS — E875 Hyperkalemia: Secondary | ICD-10-CM | POA: Diagnosis not present

## 2016-06-27 DIAGNOSIS — Z13220 Encounter for screening for lipoid disorders: Secondary | ICD-10-CM | POA: Diagnosis not present

## 2016-06-27 DIAGNOSIS — Z131 Encounter for screening for diabetes mellitus: Secondary | ICD-10-CM | POA: Diagnosis not present

## 2016-06-27 DIAGNOSIS — Z0000 Encounter for general adult medical examination without abnormal findings: Secondary | ICD-10-CM

## 2016-06-27 DIAGNOSIS — Z23 Encounter for immunization: Secondary | ICD-10-CM | POA: Diagnosis not present

## 2016-06-27 DIAGNOSIS — Z114 Encounter for screening for human immunodeficiency virus [HIV]: Secondary | ICD-10-CM

## 2016-06-27 LAB — COMPREHENSIVE METABOLIC PANEL
ALBUMIN: 4 g/dL (ref 3.5–5.2)
ALK PHOS: 62 U/L (ref 39–117)
ALT: 12 U/L (ref 0–53)
AST: 14 U/L (ref 0–37)
BILIRUBIN TOTAL: 0.4 mg/dL (ref 0.2–1.2)
BUN: 19 mg/dL (ref 6–23)
CO2: 32 mEq/L (ref 19–32)
Calcium: 9.1 mg/dL (ref 8.4–10.5)
Chloride: 103 mEq/L (ref 96–112)
Creatinine, Ser: 1.25 mg/dL (ref 0.40–1.50)
GFR: 65.3 mL/min (ref >60.00)
Glucose, Bld: 100 mg/dL — ABNORMAL HIGH (ref 70–99)
POTASSIUM: 4 meq/L (ref 3.5–5.1)
Sodium: 140 mEq/L (ref 135–145)
TOTAL PROTEIN: 7.3 g/dL (ref 6.0–8.3)

## 2016-06-27 LAB — LIPID PANEL
Cholesterol: 164 mg/dL (ref 0–200)
HDL: 41.1 mg/dL
LDL Cholesterol: 103 mg/dL — ABNORMAL HIGH (ref 0–99)
NonHDL: 123.28
Total CHOL/HDL Ratio: 4
Triglycerides: 101 mg/dL (ref 0.0–149.0)
VLDL: 20.2 mg/dL (ref 0.0–40.0)

## 2016-06-27 LAB — HEMOGLOBIN A1C: Hgb A1c MFr Bld: 5.9 % (ref 4.6–6.5)

## 2016-06-27 LAB — HIV ANTIBODY (ROUTINE TESTING W REFLEX): HIV 1&2 Ab, 4th Generation: NONREACTIVE

## 2016-06-27 NOTE — Patient Instructions (Signed)
Clean up the diet and get back to the gym!

## 2016-06-27 NOTE — Progress Notes (Signed)
Chief Complaint  Patient presents with  . Establish Care    Pt would like to get a physical, check BP, and cholesterol    Well Male [name] [name] is here for a complete physical.   His last physical was >1 year(s) ago.  Current diet: improving, healthy in general Current exercise: works out 1-2 days weekly, trying to get into the routine again Weight trend: stable Does pt snore? Yes.  Attributed to allergies.  Daytime fatigue? No. Seat belt? Yes.   Concerns: Worried about his cholesterol and BP. Told he had elevated triglycerides in the past. Does have hx of elevated BP   Past Medical History:  Diagnosis Date  . Bilateral inguinal hernia   . Hyperlipidemia   . Hypertension   . Multiple sclerosis ([place])   . Nephrolithiasis   . Neuromuscular disorder [facility])    MS    Past Surgical History:  Procedure Laterality Date  . HERNIA REPAIR  08/27/12   bilateral repair with mesh  . SQUAMOUS CELL CARCINOMA EXCISION     From the forehead  . VASECTOMY     Medications  Current Outpatient Prescriptions on File Prior to Visit  Medication Sig Dispense Refill  . cholecalciferol (VITAMIN D) 1000 UNITS tablet Take 1,000 Units by mouth daily.    . Fingolimod HCl (GILENYA) 0.5 MG CAPS Take 1 capsule by mouth daily.    Marland Kitchen gabapentin (NEURONTIN) 300 MG capsule Take 1 capsule (300 mg total) by mouth 3 (three) times daily. 270 capsule 3  . Multiple Vitamin (MULTIVITAMIN WITH MINERALS) TABS Take 1 tablet by mouth daily.    . Olopatadine HCl (PATANASE) 0.6 % SOLN Place 1 each into the nose at bedtime.    . vitamin B-12 (CYANOCOBALAMIN) 1000 MCG tablet Take 1,000 mcg by mouth daily.     Current Facility-Administered Medications on File Prior to Visit  Medication Dose Route Frequency Provider Last Rate Last Dose  . gadopentetate dimeglumine (MAGNEVIST) injection 14 mL  14 mL Intravenous Once PRN Kathrynn Ducking, MD        Allergies Allergies  Allergen Reactions  . Mold Extract [Trichophyton]      congestion  . Penicillins Other (See Comments)    Reaction in childhood. Does not remember.   Family History Family History  Problem Relation Age of Onset  . Hypertension Father   . Colon cancer    . Heart attack Sister   . Cancer Maternal Grandfather     colon    Review of Systems: Constitutional:  no unexpected change in weight, no fevers or chills Eye:  no recent significant change in vision Ear/Nose/Mouth/Throat:  Ears:  no tinnitus or hearing loss Nose/Mouth/Throat:  no complaints of nasal congestion or bleeding, no sore throat and oral sores Cardiovascular:  no chest pain, no palpitations Respiratory:  no cough and no shortness of breath Gastrointestinal:  no abdominal pain, no change in bowel habits, no nausea, vomiting, diarrhea, or constipation and no black or bloody stool GU:  Male: negative for dysuria, frequency, and incontinence and negative for prostate symptoms Musculoskeletal/Extremities:  no pain, redness, or swelling of the joints Integumentary (Skin/Breast):  no abnormal skin lesions reported Neurologic:  no headaches, +numbness/tingling Endocrine:  weight changes, masses in the neck, heat/cold intolerance, bowel or skin changes, or cardiovascular system symptoms Hematologic/Lymphatic:  no abnormal bleeding, no HIV risk factors, no night sweats, no swollen nodes, no weight loss  Exam BP (!) 146/92 (BP Location: Right Arm, Patient Position: Sitting, Cuff Size:  Large)   Pulse 67   Temp 97.6 F (36.4 C) (Oral)   Ht 5\' 5"  (1.651 m)   Wt 140 lb 3.2 oz (63.6 kg)   SpO2 98%   BMI 23.33 kg/m  General:  well developed, well nourished, in no apparent distress Skin:  no significant moles, warts, or growths Head:  no masses, lesions, or tenderness Eyes:  pupils equal and round, sclera anicteric without injection Ears:  canals without lesions, TMs shiny without retraction, no obvious effusion, no erythema Nose:  nares patent, septum midline, mucosa  normal Throat/Pharynx:  lips and gingiva without lesion; tongue and uvula midline; non-inflamed pharynx; no exudates or postnasal drainage Neck: neck supple without adenopathy, thyromegaly, or masses Lungs:  clear to auscultation, breath sounds equal bilaterally, no respiratory distress Cardio:  regular rate and rhythm without murmurs, heart sounds without clicks or rubs Abdomen:  abdomen soft, nontender; bowel sounds normal; no masses or organomegaly Genital (male): circumcised penis, no lesions or discharge; testes present bilaterally without masses or tenderness Rectal: Deferred Musculoskeletal:  symmetrical muscle groups noted without atrophy or deformity Extremities:  no clubbing, cyanosis, or edema, no deformities, no skin discoloration Neuro:  gait normal; deep tendon reflexes normal and symmetric Psych: well oriented with normal range of affect and appropriate judgment/insight  Assessment and Plan  Well adult exam - Plan: Comprehensive metabolic panel  Elevated blood pressure reading  Hyperkalemia - Plan: Comprehensive metabolic panel  Hypertriglyceridemia - Plan: Lipid panel  Screening cholesterol level - Plan: Lipid panel  Screening for diabetes mellitus - Plan: Hemoglobin A1c  Encounter for screening for HIV - Plan: HIV antibody   Well 50 y.o. male. Counseled on diet and exercise. Immunizations, labs, and further orders as above. Follow up in 2 weeks for nurse BP check, if labs abnormal will change to a visit with me. The patient voiced understanding and agreement to the plan.  [place], DO 06/27/16 9:23 AM

## 2016-06-27 NOTE — Progress Notes (Signed)
Pre visit review using our clinic review tool, if applicable. No additional management support is needed unless otherwise documented below in the visit note. 

## 2016-06-27 NOTE — Addendum Note (Signed)
Addended by: Isabela Cellar on: 06/27/2016 11:09 AM   Modules accepted: Orders

## 2016-06-28 NOTE — Telephone Encounter (Signed)
LMVM for wife of pt, to have pt call me back or she can re: to his gilenya (new MS med he is taking).

## 2016-07-02 NOTE — Telephone Encounter (Signed)
Spoke to pt and he is doing well.  Taking gilenya since 06-21-16.  Has been in touch with his specialty pharmacy [facility] for delivery.  Dr. Herbert Deaner 's partner did his eye exam.  [phone].  LMVM for Dr. Payton Emerald office MR to fax over eye exam.

## 2016-07-02 NOTE — Telephone Encounter (Signed)
Notes from his ophthalmologist suggest mild cataracts bilaterally, no macular edema. Low risk for glaucoma.

## 2016-07-02 NOTE — Telephone Encounter (Signed)
Received MR from Dr. Herbert Deaner.  To MM/NP for review.

## 2016-07-11 ENCOUNTER — Ambulatory Visit (INDEPENDENT_AMBULATORY_CARE_PROVIDER_SITE_OTHER): Payer: Managed Care, Other (non HMO) | Admitting: *Deleted

## 2016-07-11 VITALS — BP 130/96 | HR 74

## 2016-07-11 DIAGNOSIS — R030 Elevated blood-pressure reading, without diagnosis of hypertension: Secondary | ICD-10-CM

## 2016-07-11 NOTE — Progress Notes (Signed)
Pre visit review using our clinic review tool, if applicable. No additional management support is needed unless otherwise documented below in the visit note.  Per 06/27/16 AVS: Return in about 2 weeks (around 07/11/2016) for Nurse visit to recheck BP.   Pt currently on no BP medications, does not check BP at home. Reports when he was working out previously, BP averaged 120s/80s. He is trying to get back to the gym, but is having a hard time getting back into regular exercise.   Per Dr. Nani Ravens: Follow up office visit in 2-4 weeks. Focus on healthy diet and regular exercise.   Pt notified of results and verbalized understanding. Follow-up appointment scheduled as directed.  Dorrene German, RN

## 2016-07-16 NOTE — Progress Notes (Signed)
Noted and agree. 

## 2016-08-08 ENCOUNTER — Encounter: Payer: Self-pay | Admitting: Family Medicine

## 2016-08-08 ENCOUNTER — Ambulatory Visit (INDEPENDENT_AMBULATORY_CARE_PROVIDER_SITE_OTHER): Payer: Managed Care, Other (non HMO) | Admitting: Family Medicine

## 2016-08-08 VITALS — BP 130/80 | HR 81 | Temp 97.8°F | Ht 65.0 in | Wt 140.4 lb

## 2016-08-08 DIAGNOSIS — R030 Elevated blood-pressure reading, without diagnosis of hypertension: Secondary | ICD-10-CM | POA: Diagnosis not present

## 2016-08-08 NOTE — Patient Instructions (Signed)
Bring your home blood pressure cuff to your nurse visit to compare readings.

## 2016-08-08 NOTE — Progress Notes (Signed)
Pre visit review using our clinic review tool, if applicable. No additional management support is needed unless otherwise documented below in the visit note. 

## 2016-08-08 NOTE — Progress Notes (Signed)
Chief Complaint  Patient presents with  . Follow-up    on BP    Subjective [name] [name] is a 50 y.o. male who presents for hypertension follow up. He has been monitoring home blood pressures 1x/week. Blood pressures ranging in 140's/90's on average. He not on any medications. He is sometimes adhering to a low sodium and low fat diet. Current exercise: walking, lifting weights   Past Medical History:  Diagnosis Date  . Bilateral inguinal hernia   . Hyperlipidemia   . Hypertension   . Multiple sclerosis ([place])   . Nephrolithiasis   . Neuromuscular disorder ([place])    MS   Family History  Problem Relation Age of Onset  . Hypertension Father   . Colon cancer    . Heart attack Sister   . Cancer Maternal Grandfather     colon     Medications Current Outpatient Prescriptions on File Prior to Visit  Medication Sig Dispense Refill  . cholecalciferol (VITAMIN D) 1000 UNITS tablet Take 1,000 Units by mouth daily.    . Fingolimod HCl (GILENYA) 0.5 MG CAPS Take 1 capsule by mouth daily.    Marland Kitchen gabapentin (NEURONTIN) 300 MG capsule Take 1 capsule (300 mg total) by mouth 3 (three) times daily. 270 capsule 3  . Multiple Vitamin (MULTIVITAMIN WITH MINERALS) TABS Take 1 tablet by mouth daily.    . Olopatadine HCl (PATANASE) 0.6 % SOLN Place 1 each into the nose at bedtime.    . vitamin B-12 (CYANOCOBALAMIN) 1000 MCG tablet Take 1,000 mcg by mouth daily.     Current Facility-Administered Medications on File Prior to Visit  Medication Dose Route Frequency Provider Last Rate Last Dose  . gadopentetate dimeglumine (MAGNEVIST) injection 14 mL  14 mL Intravenous Once PRN Kathrynn Ducking, MD       Allergies Allergies  Allergen Reactions  . Mold Extract [Trichophyton]     congestion  . Penicillins Other (See Comments)    Reaction in childhood. Does not remember.    Review of Systems Cardiovascular: no chest pain Respiratory:  no shortness of breath  Exam BP 130/80 (BP Location:  Left Arm, Patient Position: Sitting, Cuff Size: Normal)   Pulse 81   Temp 97.8 F (36.6 C) (Oral)   Ht 5\' 5"  (1.651 m)   Wt 140 lb 6.4 oz (63.7 kg)   SpO2 98%   BMI 23.36 kg/m  General:  well developed, well nourished, in no apparent distress Skin:  warm, no pallor or diaphoresis Eyes:  pupils equal and round, sclera anicteric without injection Lungs:  clear to auscultation, breath sounds equal bilaterally, no accessory muscle use Cardio:  regular rate and rhythm without murmurs, no LE edema, no bruits Psych: well oriented with normal range of affect and appropriate judgment/insight  Elevated blood pressure reading  Hold off on meds for now. Nurse visit in 4 weeks to recheck BP. I want him to keep a home log with BP's taken at various times of day. Bring home cuff to appt to compare values. His 10 yr CVD is 3.1%, so would not recommend tx unless SBP 140 or greater or DBP 90 or greater. The patient voiced understanding and agreement to the plan.  [place], DO 08/08/16  9:28 AM

## 2016-09-03 ENCOUNTER — Ambulatory Visit (INDEPENDENT_AMBULATORY_CARE_PROVIDER_SITE_OTHER): Payer: Managed Care, Other (non HMO) | Admitting: Neurology

## 2016-09-03 ENCOUNTER — Encounter: Payer: Self-pay | Admitting: Neurology

## 2016-09-03 VITALS — BP 150/100 | HR 71 | Ht 65.0 in | Wt 138.0 lb

## 2016-09-03 DIAGNOSIS — G35 Multiple sclerosis: Secondary | ICD-10-CM | POA: Diagnosis not present

## 2016-09-03 DIAGNOSIS — Z5181 Encounter for therapeutic drug level monitoring: Secondary | ICD-10-CM | POA: Diagnosis not present

## 2016-09-03 NOTE — Progress Notes (Signed)
Reason for visit: Multiple sclerosis  [name] [name] is an 50 y.o. male  History of present illness:  [name] [name] is a 50 year old right-handed white male with a history of multiple sclerosis. The patient has recently been switched from [place] to [place]. He has tolerated the medication well. MRI evaluation of the cervical spine done in September 2017 did show some progression from 2008. The patient has had some shocklike sensations down the arms when he elevates the arm above his head. He may have some tingling sensations into the hands. If he stands up too quickly he may also get a shock sensation down the spine. He does have some urgency and difficulty voiding the bladder at times. He reports no numbness of the extremities. He denies any significant gait instability, and no falls. He denies a visual disturbance or memory problems. The patient comes to this office for an evaluation. He has been seen through Dr. Herbert Deaner from ophthalmology.  Past Medical History:  Diagnosis Date  . Bilateral inguinal hernia   . Hyperlipidemia   . Hypertension   . Multiple sclerosis ([place])   . Nephrolithiasis   . Neuromuscular disorder [facility])    MS    Past Surgical History:  Procedure Laterality Date  . HERNIA REPAIR  08/27/12   bilateral repair with mesh  . SQUAMOUS CELL CARCINOMA EXCISION     From the forehead  . VASECTOMY      Family History  Problem Relation Age of Onset  . Hypertension Father   . Colon cancer    . Heart attack Sister   . Cancer Maternal Grandfather     colon    Social history:  reports that he has never smoked. He has never used smokeless tobacco. He reports that he drinks alcohol. He reports that he does not use drugs.    Allergies  Allergen Reactions  . Mold Extract [Trichophyton]     congestion  . Penicillins Other (See Comments)    Reaction in childhood. Does not remember.    Medications:  Prior to Admission medications   Medication Sig Start Date End  Date Taking? Authorizing Provider  Cholecalciferol (VITAMIN D PO) Take 5,000 Units by mouth daily.    Yes Historical Provider, MD  Fingolimod HCl (GILENYA) 0.5 MG CAPS Take 1 capsule by mouth daily.   Yes Historical Provider, MD  gabapentin (NEURONTIN) 300 MG capsule Take 1 capsule (300 mg total) by mouth 3 (three) times daily. 10/11/15  Yes Ward Givens, NP  Multiple Vitamin (MULTIVITAMIN WITH MINERALS) TABS Take 1 tablet by mouth daily.   Yes Historical Provider, MD  vitamin B-12 (CYANOCOBALAMIN) 1000 MCG tablet Take 1,000 mcg by mouth daily.   Yes Historical Provider, MD    ROS:  Out of a complete 14 system review of symptoms, the patient complains only of the following symptoms, and all other reviewed systems are negative.  Snoring Cold intolerance Environmental allergies Difficulty urinating Moles Numbness, tremors  Blood pressure (!) 150/100, pulse 71, height 5\' 5"  (1.651 m), weight 138 lb (62.6 kg).  Physical Exam  General: The patient is alert and cooperative at the time of the examination.  Skin: No significant peripheral edema is noted.   Neurologic Exam  Mental status: The patient is alert and oriented x 3 at the time of the examination. The patient has apparent normal recent and remote memory, with an apparently normal attention span and concentration ability.   Cranial nerves: Facial symmetry is present. Speech is normal, no  aphasia or dysarthria is noted. Extraocular movements are full. Visual fields are full. Pupils are equal, round, and reactive to light. Discs are flat bilaterally.  Motor: The patient has good strength in all 4 extremities.  Sensory examination: Soft touch sensation is symmetric on the face, arms, and legs.  Coordination: The patient has good finger-nose-finger and heel-to-shin bilaterally.  Gait and station: The patient has a normal gait. Tandem gait is normal. Romberg is negative. No drift is seen.  Reflexes: Deep tendon reflexes are  symmetric.   MRI cervical 05/28/16:  IMPRESSION:  This MRI of the cervical spine with and without contrast shows the following: 1.   Multiple T2/FLAIR hyperintense foci within the spinal cord although this most likely represents demyelinating plaque associated with multiple sclerosis, the longitudinal nature of some of the lesions could also be due to neuromyelitis optica or vasculitis.   There has been progression since the MRI dated 05/27/2007 2.   Adjacent to C4-C5, there is patchy posterior enhancement that could represent more acute to subacute change. 3.   There are no significant degenerative changes  * MRI scan images were reviewed online. I agree with the written report.    Assessment/Plan:  1. Multiple sclerosis  The patient will continue the [place] for now. We will check blood work today to include a NMO antibody. He will follow-up in 6 months, sooner if needed. I have recommended a repeat ophthalmologic evaluation 6 months after the onset of Gilenya therapy.  Jill Alexanders MD 09/03/2016 10:48 AM  [facility] [address] [place], [place] [address]  Phone [phone] Fax [phone]

## 2016-09-04 ENCOUNTER — Telehealth: Payer: Self-pay

## 2016-09-04 ENCOUNTER — Telehealth: Payer: Self-pay | Admitting: Neurology

## 2016-09-04 NOTE — Telephone Encounter (Signed)
JC virus antibody was negative. This was done 06/04/2016.

## 2016-09-04 NOTE — Telephone Encounter (Signed)
Received faxed lab report. JCV antibody negative.

## 2016-09-04 NOTE — Telephone Encounter (Signed)
Called [facility] to check on JCV antibody results from labs drawn 06/04/16. JCV was done and results will be faxed to [phone].

## 2016-09-05 ENCOUNTER — Ambulatory Visit (INDEPENDENT_AMBULATORY_CARE_PROVIDER_SITE_OTHER): Payer: Managed Care, Other (non HMO) | Admitting: Family Medicine

## 2016-09-05 VITALS — BP 153/99 | HR 96

## 2016-09-05 DIAGNOSIS — I10 Essential (primary) hypertension: Secondary | ICD-10-CM

## 2016-09-05 LAB — CBC WITH DIFFERENTIAL/PLATELET
BASOS: 1 %
Basophils Absolute: 0 10*3/uL (ref 0.0–0.2)
EOS (ABSOLUTE): 0.2 10*3/uL (ref 0.0–0.4)
EOS: 6 %
HEMATOCRIT: 39.8 % (ref 37.5–51.0)
HEMOGLOBIN: 13.8 g/dL (ref 13.0–17.7)
IMMATURE GRANULOCYTES: 1 %
Immature Grans (Abs): 0 10*3/uL (ref 0.0–0.1)
LYMPHS ABS: 0.7 10*3/uL (ref 0.7–3.1)
Lymphs: 22 %
MCH: 29.4 pg (ref 26.6–33.0)
MCHC: 34.7 g/dL (ref 31.5–35.7)
MCV: 85 fL (ref 79–97)
MONOCYTES: 18 %
Monocytes Absolute: 0.6 10*3/uL (ref 0.1–0.9)
Neutrophils Absolute: 1.7 10*3/uL (ref 1.4–7.0)
Neutrophils: 52 %
Platelets: 226 10*3/uL (ref 150–379)
RBC: 4.69 x10E6/uL (ref 4.14–5.80)
RDW: 14.3 % (ref 12.3–15.4)
WBC: 3.2 10*3/uL — AB (ref 3.4–10.8)

## 2016-09-05 LAB — NEUROMYELITIS OPTICA AUTOAB, IGG: NMO IgG Autoantibodies: <1.5 U/mL (ref 0.0–3.0)

## 2016-09-05 NOTE — Progress Notes (Signed)
Pre visit review using our clinic tool,if applicable. No additional management support is needed unless otherwise documented below in the visit note.   Patient in for BP check per Dr. Nani Ravens. BP = 153/99 Pulse= 96

## 2016-09-05 NOTE — Progress Notes (Signed)
Agree with above 

## 2016-09-12 ENCOUNTER — Ambulatory Visit (INDEPENDENT_AMBULATORY_CARE_PROVIDER_SITE_OTHER): Payer: Managed Care, Other (non HMO) | Admitting: Family Medicine

## 2016-09-12 ENCOUNTER — Encounter: Payer: Self-pay | Admitting: Family Medicine

## 2016-09-12 VITALS — BP 148/82 | HR 75 | Temp 97.6°F | Ht 65.0 in | Wt 140.0 lb

## 2016-09-12 DIAGNOSIS — I10 Essential (primary) hypertension: Secondary | ICD-10-CM | POA: Diagnosis not present

## 2016-09-12 MED ORDER — AMLODIPINE BESYLATE 5 MG PO TABS: 5.0000 mg | ORAL_TABLET | Freq: Every day | ORAL | 1 refills | Status: DC

## 2016-09-12 NOTE — Progress Notes (Signed)
Chief Complaint  Patient presents with  . Follow-up    HTN    Subjective [name] [name] is a 50 y.o. male who presents for hypertension follow up. He does monitor home blood pressures. Blood pressures ranging from 150's/90's on average. Patient has these side effects of medication: none He is adhering to a low sodium and low fat diet. Current exercise: Cardio, weight lifting   Past Medical History:  Diagnosis Date  . Bilateral inguinal hernia   . Hyperlipidemia   . Hypertension   . Multiple sclerosis ([place])   . Nephrolithiasis   . Neuromuscular disorder ([place])    MS   Family History  Problem Relation Age of Onset  . Hypertension Father   . Colon cancer    . Heart attack Sister   . Cancer Maternal Grandfather     colon     Medications Current Outpatient Prescriptions on File Prior to Visit  Medication Sig Dispense Refill  . Cholecalciferol (VITAMIN D PO) Take 5,000 Units by mouth daily.     . Fingolimod HCl (GILENYA) 0.5 MG CAPS Take 1 capsule by mouth daily.    Marland Kitchen gabapentin (NEURONTIN) 300 MG capsule Take 1 capsule (300 mg total) by mouth 3 (three) times daily. 270 capsule 3  . Multiple Vitamin (MULTIVITAMIN WITH MINERALS) TABS Take 1 tablet by mouth daily.    . vitamin B-12 (CYANOCOBALAMIN) 1000 MCG tablet Take 1,000 mcg by mouth daily.     Current Facility-Administered Medications on File Prior to Visit  Medication Dose Route Frequency Provider Last Rate Last Dose  . gadopentetate dimeglumine (MAGNEVIST) injection 14 mL  14 mL Intravenous Once PRN Kathrynn Ducking, MD       Allergies Allergies  Allergen Reactions  . Mold Extract [Trichophyton]     congestion  . Penicillins Other (See Comments)    Reaction in childhood. Does not remember.    Review of Systems Cardiovascular: no chest pain Respiratory:  no shortness of breath  Exam BP (!) 148/82 (BP Location: Left Arm, Patient Position: Sitting, Cuff Size: Small)   Pulse 75   Temp 97.6 F (36.4 C) (Oral)    Ht 5\' 5"  (1.651 m)   Wt 140 lb (63.5 kg)   SpO2 98%   BMI 23.30 kg/m  General:  well developed, well nourished, in no apparent distress Skin:  warm, no pallor or diaphoresis Eyes:  pupils equal and round, sclera anicteric without injection Neck: neck supple without adenopathy, thyromegaly, masses Heart :RRR, no murmurs, no bruits, no LE edema Lungs:  clear to auscultation, breath sounds equal bilaterally Psych: well oriented with normal range of affect and appropriate judgment/insight  Essential hypertension - Plan: amLODipine (NORVASC) 5 MG tablet  Orders as above. Start Norvasc. Keep taking BP's and bringing them to appt. F/u in 1 mo. The patient voiced understanding and agreement to the plan.  [place], DO 09/12/16  8:26 AM

## 2016-09-12 NOTE — Progress Notes (Signed)
Pre visit review using our clinic review tool, if applicable. No additional management support is needed unless otherwise documented below in the visit note. 

## 2016-09-12 NOTE — Patient Instructions (Signed)
Claritin (loratadine), Allegra (fexofenadine), Zyrtec (cetirizine); these are listed in order from weakest to strongest. Generic, and therefore cheaper, options are in the parentheses.   There are available OTC, and the generic versions, which may be cheaper, are in parentheses. Show this to a pharmacist if you have trouble finding any of these items.

## 2016-10-09 ENCOUNTER — Ambulatory Visit: Payer: Managed Care, Other (non HMO) | Admitting: Neurology

## 2016-10-11 ENCOUNTER — Ambulatory Visit: Payer: Managed Care, Other (non HMO) | Admitting: Family Medicine

## 2016-10-17 ENCOUNTER — Ambulatory Visit (INDEPENDENT_AMBULATORY_CARE_PROVIDER_SITE_OTHER): Payer: Managed Care, Other (non HMO) | Admitting: Family Medicine

## 2016-10-17 ENCOUNTER — Encounter: Payer: Self-pay | Admitting: Family Medicine

## 2016-10-17 VITALS — BP 128/81 | HR 76 | Temp 98.0°F | Wt 140.0 lb

## 2016-10-17 DIAGNOSIS — I10 Essential (primary) hypertension: Secondary | ICD-10-CM

## 2016-10-17 MED ORDER — AMLODIPINE BESYLATE 10 MG PO TABS: 10.0000 mg | ORAL_TABLET | Freq: Every day | ORAL | 1 refills | Status: DC

## 2016-10-17 NOTE — Progress Notes (Signed)
Chief Complaint  Patient presents with  . Follow-up    BP check. Patient brought in a copy of HTN readings.    Subjective [name] [name] is a 51 y.o. male who presents for hypertension follow up. He does monitor home blood pressures. Blood pressures ranging from 130-140's/80-90's on average. He is compliant with medications- Norvasc 5 mg, started at most recent appt. Patient has these side effects of medication: none He is adhering to a healthy diet overall. Current exercise: Weight lifting- 4-5 days per week   Past Medical History:  Diagnosis Date  . Bilateral inguinal hernia   . Hyperlipidemia   . Hypertension   . Multiple sclerosis ([place])   . Nephrolithiasis   . Neuromuscular disorder ([place])    MS   Family History  Problem Relation Age of Onset  . Hypertension Father   . Colon cancer    . Heart attack Sister   . Cancer Maternal Grandfather     colon     Medications Current Outpatient Prescriptions on File Prior to Visit  Medication Sig Dispense Refill  . amLODipine (NORVASC) 5 MG tablet Take 1 tablet (5 mg total) by mouth daily. 30 tablet 1  . Cholecalciferol (VITAMIN D PO) Take 5,000 Units by mouth daily.     . Fingolimod HCl (GILENYA) 0.5 MG CAPS Take 1 capsule by mouth daily.    Marland Kitchen gabapentin (NEURONTIN) 300 MG capsule Take 1 capsule (300 mg total) by mouth 3 (three) times daily. 270 capsule 3  . Multiple Vitamin (MULTIVITAMIN WITH MINERALS) TABS Take 1 tablet by mouth daily.    . vitamin B-12 (CYANOCOBALAMIN) 1000 MCG tablet Take 1,000 mcg by mouth daily.     Allergies Allergies  Allergen Reactions  . Mold Extract [Trichophyton]     congestion  . Penicillins Other (See Comments)    Reaction in childhood. Does not remember.    Review of Systems Cardiovascular: no chest pain Respiratory:  no shortness of breath  Exam BP 128/81 (BP Location: Right Arm, Patient Position: Sitting, Cuff Size: Normal)   Pulse 76   Temp 98 F (36.7 C) (Oral)   Wt 140 lb  (63.5 kg)   SpO2 100% Comment: RA  BMI 23.30 kg/m  General:  well developed, well nourished, in no apparent distress Skin:  warm, no pallor or diaphoresis Eyes:  pupils equal and round, sclera anicteric without injection Heart :RRR, no murmurs, no bruits, no LE edema Lungs:  clear to auscultation, no accessory muscle use Psych: well oriented with normal range of affect and appropriate judgment/insight  Essential hypertension - Plan: amLODipine (NORVASC) 10 MG tablet  Orders as above. Increased dose of Norvasc to 10 mg daily. Continue to check home blood pressure. Counsled on continuing good work with diet and exercise, gave written information in AVS for dietary guidelines. He is going to start more cardio. F/u in 1 mo. The patient voiced understanding and agreement to the plan.  [place], DO 10/17/16  9:54 AM

## 2016-10-17 NOTE — Progress Notes (Signed)
Pre visit review using our clinic review tool, if applicable. No additional management support is needed unless otherwise documented below in the visit note. 

## 2016-10-17 NOTE — Patient Instructions (Addendum)
Take 2 tabs of Norvasc until you run out.  A new strength is called in.  I want the top number less than 140 consistently and the bottom number less than 90 consistently.  Healthy Eating Plan Many factors influence your heart health, including eating and exercise habits. Heart (coronary) risk increases with abnormal blood fat (lipid) levels. Heart-healthy meal planning includes limiting unhealthy fats, increasing healthy fats, and making other small dietary changes. This includes maintaining a healthy body weight to help keep lipid levels within a normal range.  WHAT IS MY PLAN?  Your health care provider recommends that you:  Drink a glass of water before meals to help with satiety.  Eat slowly.  An alternative to the water is to add Metamucil. This will help with satiety as well. It does contain calories, unlike water.  WHAT TYPES OF FAT SHOULD I CHOOSE?  Choose healthy fats more often. Choose monounsaturated and polyunsaturated fats, such as olive oil and canola oil, flaxseeds, walnuts, almonds, and seeds.  Eat more omega-3 fats. Good choices include salmon, mackerel, sardines, tuna, flaxseed oil, and ground flaxseeds. Aim to eat fish at least two times each week.  Avoid foods with partially hydrogenated oils in them. These contain trans fats. Examples of foods that contain trans fats are stick margarine, some tub margarines, cookies, crackers, and other baked goods. If you are going to avoid a fat, this is the one to avoid!  WHAT GENERAL GUIDELINES DO I NEED TO FOLLOW?  Check food labels carefully to identify foods with trans fats. Avoid these types of options when possible.  Fill one half of your plate with vegetables and green salads. Eat 4-5 servings of vegetables per day. A serving of vegetables equals 1 cup of raw leafy vegetables,  cup of raw or cooked cut-up vegetables, or  cup of vegetable juice.  Fill one fourth of your plate with whole grains. Look for the word "whole"  as the first word in the ingredient list.  Fill one fourth of your plate with lean protein foods.  Eat 4-5 servings of fruit per day. A serving of fruit equals one medium whole fruit,  cup of dried fruit,  cup of fresh, frozen, or canned fruit. Try to avoid fruits in cups/syrups as the sugar content can be high.  Eat more foods that contain soluble fiber. Examples of foods that contain this type of fiber are apples, broccoli, carrots, beans, peas, and barley. Aim to get 20-30 g of fiber per day.  Eat more home-cooked food and less restaurant, buffet, and fast food.  Limit or avoid alcohol.  Limit foods that are high in starch and sugar.  Avoid fried foods when able.  Cook foods by using methods other than frying. Baking, boiling, grilling, and broiling are all great options. Other fat-reducing suggestions include: ? Removing the skin from poultry. ? Removing all visible fats from meats. ? Skimming the fat off of stews, soups, and gravies before serving them. ? Steaming vegetables in water or broth.  Lose weight if you are overweight. Losing just 5-10% of your initial body weight can help your overall health and prevent diseases such as diabetes and heart disease.  Increase your consumption of nuts, legumes, and seeds to 4-5 servings per week. One serving of dried beans or legumes equals  cup after being cooked, one serving of nuts equals 1 ounces, and one serving of seeds equals  ounce or 1 tablespoon.  WHAT ARE GOOD FOODS CAN I EAT? Grains  Grainy breads (try to find bread that is 3 g of fiber per slice or greater), oatmeal, light popcorn. Whole-grain cereals. Rice and pasta, including brown rice and those that are made with whole wheat. Edamame pasta is a great alternative to grain pasta. It has a higher protein content. Try to avoid significant consumption of white bread, sugary cereals, or pastries/baked goods.  Vegetables All vegetables. Cooked white potatoes do not count as  vegetables.  Fruits All fruits, but limit pineapple and bananas as these fruits have a higher sugar content.  Meats and Other Protein Sources Lean, well-trimmed beef, veal, pork, and lamb. Chicken and [place] without skin. All fish and shellfish. Wild duck, rabbit, pheasant, and venison. Egg whites or low-cholesterol egg substitutes. Dried beans, peas, lentils, and tofu.Seeds and most nuts.  Dairy Low-fat or nonfat cheeses, including ricotta, string, and mozzarella. Skim or 1% milk that is liquid, powdered, or evaporated. Buttermilk that is made with low-fat milk. Nonfat or low-fat yogurt. Soy/Almond milk are good alternatives if you cannot handle dairy.  Beverages Water is the best for you. Sports drinks with less sugar are more desirable unless you are a highly active athlete.  Sweets and Desserts Sherbets and fruit ices. Honey, jam, marmalade, jelly, and syrups. Dark chocolate.  Eat all sweets and desserts in moderation.  Fats and Oils Nonhydrogenated (trans-free) margarines. Vegetable oils, including soybean, sesame, sunflower, olive, peanut, safflower, corn, canola, and cottonseed. Salad dressings or mayonnaise that are made with a vegetable oil. Limit added fats and oils that you use for cooking, baking, salads, and as spreads.  Other Cocoa powder. Coffee and tea. Most condiments.  The items listed above may not be a complete list of recommended foods or beverages. Contact your dietitian for more options.

## 2016-11-19 ENCOUNTER — Ambulatory Visit (INDEPENDENT_AMBULATORY_CARE_PROVIDER_SITE_OTHER): Payer: Managed Care, Other (non HMO) | Admitting: Family Medicine

## 2016-11-19 ENCOUNTER — Encounter: Payer: Self-pay | Admitting: Family Medicine

## 2016-11-19 VITALS — BP 125/107 | HR 78 | Temp 97.7°F | Ht 65.0 in | Wt 141.2 lb

## 2016-11-19 DIAGNOSIS — I10 Essential (primary) hypertension: Secondary | ICD-10-CM | POA: Diagnosis not present

## 2016-11-19 LAB — BASIC METABOLIC PANEL
BUN: 14 mg/dL (ref 6–23)
CHLORIDE: 105 meq/L (ref 96–112)
CO2: 29 meq/L (ref 19–32)
Calcium: 9.1 mg/dL (ref 8.4–10.5)
Creatinine, Ser: 1.22 mg/dL (ref 0.40–1.50)
GFR: 67.04 mL/min (ref >60.00)
GLUCOSE: 96 mg/dL (ref 70–99)
POTASSIUM: 4.1 meq/L (ref 3.5–5.1)
Sodium: 143 mEq/L (ref 135–145)

## 2016-11-19 MED ORDER — AMLODIPINE BESYLATE-VALSARTAN 5-160 MG PO TABS: 1.0000 | ORAL_TABLET | Freq: Every day | ORAL | 1 refills | Status: DC

## 2016-11-19 NOTE — Patient Instructions (Signed)
Around 3 times per week, check your blood pressure 4 times per day. Twice in the morning and twice in the evening. The readings should be at least one minute apart. Write down these values and bring them to your next nurse visit/appointment.  When you check your BP, make sure you have been doing something calm/relaxing 5 minutes prior to checking. Both feet should be flat on the floor and you should be sitting. Use your left arm and make sure it is in a relaxed position (on a table), and that the cuff is at the approximate level/height of your heart.  

## 2016-11-19 NOTE — Progress Notes (Signed)
Pre visit review using our clinic review tool, if applicable. No additional management support is needed unless otherwise documented below in the visit note. 

## 2016-11-19 NOTE — Progress Notes (Signed)
Chief Complaint  Patient presents with  . Follow-up    HTN/ Pt states he notices since he has changed to 10mg  feet are swelling with burning sensation    Subjective [name] [name] is a 51 y.o. male who presents for hypertension follow up. He does monitor home blood pressures. Blood pressures ranging from 130-140's/80-90's on average. He is compliant with medications- Norvasc 10 mg daily. Patient has these side effects of medication: swelling/burning in feet - burning may be due to MS He is adhering to a healthy diet overall. Current exercise: 3-4 times per week   Past Medical History:  Diagnosis Date  . Bilateral inguinal hernia   . Hyperlipidemia   . Hypertension   . Multiple sclerosis ([place])   . Nephrolithiasis   . Neuromuscular disorder ([place])    MS   Family History  Problem Relation Age of Onset  . Hypertension Father   . Colon cancer    . Heart attack Sister   . Cancer Maternal Grandfather     colon     Medications Current Outpatient Prescriptions on File Prior to Visit  Medication Sig Dispense Refill  . amLODipine (NORVASC) 10 MG tablet Take 1 tablet (10 mg total) by mouth daily. 90 tablet 1  . Cholecalciferol (VITAMIN D PO) Take 5,000 Units by mouth daily.     . Fingolimod HCl (GILENYA) 0.5 MG CAPS Take 1 capsule by mouth daily.    Marland Kitchen gabapentin (NEURONTIN) 300 MG capsule Take 1 capsule (300 mg total) by mouth 3 (three) times daily. 270 capsule 3  . Multiple Vitamin (MULTIVITAMIN WITH MINERALS) TABS Take 1 tablet by mouth daily.    . vitamin B-12 (CYANOCOBALAMIN) 1000 MCG tablet Take 1,000 mcg by mouth daily.      Allergies Allergies  Allergen Reactions  . Mold Extract [Trichophyton]     congestion  . Penicillins Other (See Comments)    Reaction in childhood. Does not remember.    Review of Systems Cardiovascular: no chest pain Respiratory:  no shortness of breath  Exam BP (!) 125/107 (BP Location: Left Arm, Patient Position: Sitting, Cuff Size: Small)    Pulse 78   Temp 97.7 F (36.5 C) (Oral)   Ht 5\' 5"  (1.651 m)   Wt 141 lb 3.2 oz (64 kg)   SpO2 98%   BMI 23.50 kg/m  General:  well developed, well nourished, in no apparent distress Skin:  warm, no pallor or diaphoresis Eyes:  pupils equal and round, sclera anicteric without injection Heart :RRR, no murmurs, no bruits, no LE edema Lungs:  clear to auscultation, no accessory muscle use Psych: well oriented with normal range of affect and appropriate judgment/insight  Essential hypertension - Plan: amLODipine-valsartan (EXFORGE) 5-160 MG tablet, Basic Metabolic Panel (BMET), Basic Metabolic Panel (BMET)  Orders as above. Go back to 5 mg of Norvasc, add losartan for combo pill. Counseled on diet and exercise- his is doing well with this. Labs today and in 1 week. F/u in 1 mo. If still uncontrolled, could increase dose vs adding Chlorthalidone.  The patient voiced understanding and agreement to the plan.  [place], DO 11/19/16  8:21 AM

## 2016-11-28 ENCOUNTER — Other Ambulatory Visit (INDEPENDENT_AMBULATORY_CARE_PROVIDER_SITE_OTHER): Payer: Managed Care, Other (non HMO)

## 2016-11-28 DIAGNOSIS — I10 Essential (primary) hypertension: Secondary | ICD-10-CM | POA: Diagnosis not present

## 2016-11-28 LAB — BASIC METABOLIC PANEL
BUN: 18 mg/dL (ref 6–23)
CALCIUM: 9.6 mg/dL (ref 8.4–10.5)
CHLORIDE: 103 meq/L (ref 96–112)
CO2: 29 meq/L (ref 19–32)
CREATININE: 1.29 mg/dL (ref 0.40–1.50)
GFR: 62.85 mL/min (ref >60.00)
Glucose, Bld: 95 mg/dL (ref 70–99)
Potassium: 3.9 mEq/L (ref 3.5–5.1)
Sodium: 140 mEq/L (ref 135–145)

## 2016-12-17 ENCOUNTER — Encounter: Payer: Self-pay | Admitting: Family Medicine

## 2016-12-17 ENCOUNTER — Ambulatory Visit (INDEPENDENT_AMBULATORY_CARE_PROVIDER_SITE_OTHER): Payer: Managed Care, Other (non HMO) | Admitting: Family Medicine

## 2016-12-17 VITALS — BP 130/70 | HR 70 | Temp 97.7°F | Ht 65.0 in | Wt 143.8 lb

## 2016-12-17 DIAGNOSIS — I10 Essential (primary) hypertension: Secondary | ICD-10-CM

## 2016-12-17 MED ORDER — AMLODIPINE BESYLATE-VALSARTAN 5-320 MG PO TABS: 1.0000 | ORAL_TABLET | Freq: Every day | ORAL | 1 refills | Status: DC

## 2016-12-17 NOTE — Patient Instructions (Signed)
I don't want your blood pressure less than 90 on the top or less than 60 on the bottom.  Around 3 times per week, check your blood pressure 4 times per day. Twice in the morning and twice in the evening. The readings should be at least one minute apart. Write down these values and bring them to your next appointment.  When you check your BP, make sure you have been doing something calm/relaxing 5 minutes prior to checking. Both feet should be flat on the floor and you should be sitting. Use your left arm and make sure it is in a relaxed position (on a table), and that the cuff is at the approximate level/height of your heart.  Bring your blood pressure cuff to your next appointment.

## 2016-12-17 NOTE — Progress Notes (Signed)
Pre visit review using our clinic review tool, if applicable. No additional management support is needed unless otherwise documented below in the visit note. 

## 2016-12-17 NOTE — Progress Notes (Signed)
Chief Complaint  Patient presents with  . Follow-up    on BP-pt brought in BP readings    Subjective [name] [name] is a 51 y.o. male who presents for hypertension follow up. He does monitor home blood pressures. Blood pressures ranging from 130's/80's on average with some readings into the 140's and 90's. He is compliant with medications- Exforge 5-160 mg daily, recently increased from Norvasc 10 mg daily. Patient has these side effects of medication: none He is adhering to a healthy diet overall. Current exercise: 3-4 times per week, goes to gym   Past Medical History:  Diagnosis Date  . Bilateral inguinal hernia   . Hyperlipidemia   . Hypertension   . Multiple sclerosis ([place])   . Nephrolithiasis   . Neuromuscular disorder ([place])    MS   Family History  Problem Relation Age of Onset  . Hypertension Father   . Colon cancer    . Heart attack Sister   . Cancer Maternal Grandfather     colon     Medications Current Outpatient Prescriptions on File Prior to Visit  Medication Sig Dispense Refill  . Cholecalciferol (VITAMIN D PO) Take 5,000 Units by mouth daily.     . Fingolimod HCl (GILENYA) 0.5 MG CAPS Take 1 capsule by mouth daily.    Marland Kitchen gabapentin (NEURONTIN) 300 MG capsule Take 1 capsule (300 mg total) by mouth 3 (three) times daily. 270 capsule 3  . Multiple Vitamin (MULTIVITAMIN WITH MINERALS) TABS Take 1 tablet by mouth daily.    . vitamin B-12 (CYANOCOBALAMIN) 1000 MCG tablet Take 1,000 mcg by mouth daily.     Current Facility-Administered Medications on File Prior to Visit  Medication Dose Route Frequency Provider Last Rate Last Dose  . gadopentetate dimeglumine (MAGNEVIST) injection 14 mL  14 mL Intravenous Once PRN Kathrynn Ducking, MD       Allergies Allergies  Allergen Reactions  . Mold Extract [Trichophyton]     congestion  . Penicillins Other (See Comments)    Reaction in childhood. Does not remember.    Review of Systems Cardiovascular: no chest  pain Respiratory:  no shortness of breath  Exam BP 130/70 (BP Location: Left Arm, Patient Position: Sitting, Cuff Size: Normal)   Pulse 70   Temp 97.7 F (36.5 C) (Oral)   Ht 5\' 5"  (1.651 m)   Wt 143 lb 12.8 oz (65.2 kg)   SpO2 98%   BMI 23.93 kg/m  General:  well developed, well nourished, in no apparent distress Skin:  warm, no pallor or diaphoresis Eyes:  pupils equal and round, sclera anicteric without injection Heart :RRR, no murmurs, no bruits, no LE edema Lungs:  clear to auscultation, no accessory muscle use Psych: well oriented with normal range of affect and appropriate judgment/insight  Essential hypertension - Plan: amLODipine-valsartan (EXFORGE) 5-320 MG tablet  Status: uncontrolled Orders as above. Increase dose from 5-160 mg to above. We are still improving. Will add Chlorthalidone if no improvements. I would like him to bring his home BP monitor to the next appointment. Counseled on diet and exercise F/u in 1 mo. The patient voiced understanding and agreement to the plan.  [place], DO 12/17/16  8:38 AM

## 2016-12-20 ENCOUNTER — Other Ambulatory Visit: Payer: Self-pay | Admitting: Adult Health

## 2017-01-17 ENCOUNTER — Ambulatory Visit (INDEPENDENT_AMBULATORY_CARE_PROVIDER_SITE_OTHER): Payer: Managed Care, Other (non HMO) | Admitting: Family Medicine

## 2017-01-17 VITALS — BP 118/78 | HR 72 | Temp 97.5°F | Resp 14 | Ht 65.0 in | Wt 143.0 lb

## 2017-01-17 DIAGNOSIS — I10 Essential (primary) hypertension: Secondary | ICD-10-CM

## 2017-01-17 DIAGNOSIS — R21 Rash and other nonspecific skin eruption: Secondary | ICD-10-CM | POA: Diagnosis not present

## 2017-01-17 MED ORDER — AMLODIPINE BESYLATE-VALSARTAN 5-320 MG PO TABS: 1.0000 | ORAL_TABLET | Freq: Every day | ORAL | 3 refills | Status: DC

## 2017-01-17 NOTE — Progress Notes (Signed)
No chief complaint on file.   Subjective [name] [name] is a 51 y.o. male who presents for hypertension follow up. He does monitor home blood pressures. Blood pressures ranging from 120's/70's on average. He is compliant with medications Exforge 5-320 mg daily. Patient has these side effects of medication: none He is adhering to a healthy diet overall. Current exercise: works out at gym 4x/week  Has hx of rash on face, used to see derm. Photosensitive, thinking about re-establishing. Had been given topical in past. Can't remember what it was called.   Past Medical History:  Diagnosis Date  . Bilateral inguinal hernia   . Hyperlipidemia   . Hypertension   . Multiple sclerosis ([place])   . Nephrolithiasis   . Neuromuscular disorder ([place])    MS   Family History  Problem Relation Age of Onset  . Hypertension Father   . Colon cancer    . Heart attack Sister   . Cancer Maternal Grandfather     colon     Medications Current Outpatient Prescriptions on File Prior to Visit  Medication Sig Dispense Refill  . Cholecalciferol (VITAMIN D PO) Take 5,000 Units by mouth daily.     . Fingolimod HCl (GILENYA) 0.5 MG CAPS Take 1 capsule by mouth daily.    Marland Kitchen gabapentin (NEURONTIN) 300 MG capsule TAKE 1 CAPSULE THREE TIMES A DAY 270 capsule 3  . Multiple Vitamin (MULTIVITAMIN WITH MINERALS) TABS Take 1 tablet by mouth daily.    . vitamin B-12 (CYANOCOBALAMIN) 1000 MCG tablet Take 1,000 mcg by mouth daily.     Current Facility-Administered Medications on File Prior to Visit  Medication Dose Route Frequency Provider Last Rate Last Dose  . gadopentetate dimeglumine (MAGNEVIST) injection 14 mL  14 mL Intravenous Once PRN Kathrynn Ducking, MD       Allergies Allergies  Allergen Reactions  . Mold Extract [Trichophyton]     congestion  . Penicillins Other (See Comments)    Reaction in childhood. Does not remember.    Review of Systems Cardiovascular: no chest pain Respiratory:  no  shortness of breath  Exam BP 118/78 (BP Location: Left Arm, Patient Position: Sitting, Cuff Size: Normal)   Pulse 72   Temp 97.5 F (36.4 C) (Oral)   Resp 14   Ht 5\' 5"  (1.651 m)   Wt 143 lb (64.9 kg)   BMI 23.80 kg/m  General:  well developed, well nourished, in no apparent distress Heart :RRR, no murmurs, no bruits, no LE edema Lungs:  clear to auscultation, no accessory muscle use Psych: well oriented with normal range of affect and appropriate judgment/insight  Essential hypertension - Plan: amLODipine-valsartan (EXFORGE) 5-320 MG tablet  Rash  Orders as above. Well controlled. No changes. Hx of rash on face, saw derm. Thinking about getting set up with them again, can't remember what condition was called (Seb derm, rosacea?). Will call if he is interested and we will refer.  Counseled on diet and exercise- doing well with exercise and diet is improving. F/u in 6 mo or prn. The patient voiced understanding and agreement to the plan.  [place], DO 01/17/17  8:24 AM

## 2017-01-17 NOTE — Patient Instructions (Addendum)
Check your blood pressure around 1 time per month.  Call if you would like to see a dermatologist, no appt needed.  Strong work with controlling your blood pressure!

## 2017-03-04 ENCOUNTER — Ambulatory Visit (INDEPENDENT_AMBULATORY_CARE_PROVIDER_SITE_OTHER): Payer: Managed Care, Other (non HMO) | Admitting: Adult Health

## 2017-03-04 ENCOUNTER — Encounter: Payer: Self-pay | Admitting: Adult Health

## 2017-03-04 VITALS — BP 118/78 | HR 71 | Resp 16 | Ht 65.0 in | Wt 143.4 lb

## 2017-03-04 DIAGNOSIS — Z5181 Encounter for therapeutic drug level monitoring: Secondary | ICD-10-CM | POA: Diagnosis not present

## 2017-03-04 DIAGNOSIS — G35 Multiple sclerosis: Secondary | ICD-10-CM

## 2017-03-04 NOTE — Progress Notes (Signed)
PATIENT: [name] [name] DOB: Sep 13, 1966  REASON FOR VISIT: follow up- multiple sclerosis HISTORY FROM: patient  HISTORY OF PRESENT ILLNESS: [name] [name] is a 51 year old male with a history of multiple sclerosis. He returns today for follow-up. He is currently taking Gilenya and tolerating it well. He states that he is no longer having any of the shocklike sensations down the arms nor muscle spasms. He denies any changes with his gait or balance. Denies any new numbness or weakness. Denies any changes with his vision. He reports that he did see his ophthalmologist for his regular appointment and reports all was normal. Denies any changes with the bowels or bladder. He reports he is having some neck pain but feels this may be related to the waiting he is sleeping at night. He denies any new neurological symptoms. He returns today for an evaluation.  HISTORY 09/03/16: [name] [name] is a 51 year old right-handed white male with a history of multiple sclerosis. The patient has recently been switched from Copaxone to [place]. He has tolerated the medication well. MRI evaluation of the cervical spine done in September 2017 did show some progression from 2008. The patient has had some shocklike sensations down the arms when he elevates the arm above his head. He may have some tingling sensations into the hands. If he stands up too quickly he may also get a shock sensation down the spine. He does have some urgency and difficulty voiding the bladder at times. He reports no numbness of the extremities. He denies any significant gait instability, and no falls. He denies a visual disturbance or memory problems. The patient comes to this office for an evaluation. He has been seen through Dr. Herbert Deaner from ophthalmology.  REVIEW OF SYSTEMS: Out of a complete 14 system review of symptoms, the patient complains only of the following symptoms, and all other reviewed systems are negative.  See history of present  illness ALLERGIES: Allergies  Allergen Reactions  . Mold Extract [Trichophyton]     congestion  . Penicillins Other (See Comments)    Reaction in childhood. Does not remember.    HOME MEDICATIONS: Outpatient Medications Prior to Visit  Medication Sig Dispense Refill  . amLODipine-valsartan (EXFORGE) 5-320 MG tablet Take 1 tablet by mouth daily. 90 tablet 3  . Cholecalciferol (VITAMIN D PO) Take 5,000 Units by mouth daily.     . Fingolimod HCl (GILENYA) 0.5 MG CAPS Take 1 capsule by mouth daily.    Marland Kitchen gabapentin (NEURONTIN) 300 MG capsule TAKE 1 CAPSULE THREE TIMES A DAY 270 capsule 3  . Multiple Vitamin (MULTIVITAMIN WITH MINERALS) TABS Take 1 tablet by mouth daily.    . vitamin B-12 (CYANOCOBALAMIN) 1000 MCG tablet Take 1,000 mcg by mouth daily.     Facility-Administered Medications Prior to Visit  Medication Dose Route Frequency Provider Last Rate Last Dose  . gadopentetate dimeglumine (MAGNEVIST) injection 14 mL  14 mL Intravenous Once PRN Kathrynn Ducking, MD        PAST MEDICAL HISTORY: Past Medical History:  Diagnosis Date  . Bilateral inguinal hernia   . Hyperlipidemia   . Hypertension   . Multiple sclerosis ([place])   . Nephrolithiasis   . Neuromuscular disorder ([place])    MS    PAST SURGICAL HISTORY: Past Surgical History:  Procedure Laterality Date  . HERNIA REPAIR  08/27/12   bilateral repair with mesh  . SQUAMOUS CELL CARCINOMA EXCISION     From the forehead  . VASECTOMY  FAMILY HISTORY: Family History  Problem Relation Age of Onset  . Hypertension Father   . Colon cancer Unknown   . Heart attack Sister   . Cancer Maternal Grandfather        colon    SOCIAL HISTORY: Social History   Social History  . Marital status: Married    Spouse name: N/A  . Number of children: 3  . Years of education: College   Occupational History  .  [employer]   Social History Main Topics  . Smoking status: Never Smoker  . Smokeless tobacco: Never Used  . Alcohol  use 0.0 oz/week     Comment: OCC  . Drug use: No  . Sexual activity: Not on file   Other Topics Concern  . Not on file   Social History Narrative   Patient lives at home with his wife Lattie Haw) and two daughters.   Patient works full time.   Education college.   Right handed.   Caffeine three sodas daily.      PHYSICAL EXAM  Vitals:   03/04/17 0736  BP: 118/78  Pulse: 71  Resp: 16  Weight: 143 lb 6.4 oz (65 kg)  Height: 5\' 5"  (1.651 m)   Body mass index is 23.86 kg/m.  Generalized: Well developed, in no acute distress   Neurological examination  Mentation: Alert oriented to time, place, history taking. Follows all commands speech and language fluent Cranial nerve II-XII: Pupils were equal round reactive to light. Extraocular movements were full, visual field were full on confrontational test. Facial sensation and strength were normal. Uvula tongue midline. Head turning and shoulder shrug  were normal and symmetric. Motor: The motor testing reveals 5 over 5 strength of all 4 extremities. Good symmetric motor tone is noted throughout.  Sensory: Sensory testing is intact to soft touch on all 4 extremities. No evidence of extinction is noted.  Coordination: Cerebellar testing reveals good finger-nose-finger and heel-to-shin bilaterally.  Gait and station: Gait is normal. Tandem gait is normal. Romberg is negative. No drift is seen.  Reflexes: Deep tendon reflexes are symmetric and normal bilaterally.   DIAGNOSTIC DATA (LABS, IMAGING, TESTING) - I reviewed patient records, labs, notes, testing and imaging myself where available.  Lab Results  Component Value Date   WBC 3.2 (L) 09/03/2016   HGB 13.8 09/03/2016   HCT 39.8 09/03/2016   MCV 85 09/03/2016   PLT 226 09/03/2016      Component Value Date/Time   NA 140 11/28/2016 0824   NA 145 (H) 06/04/2016 0814   K 3.9 11/28/2016 0824   CL 103 11/28/2016 0824   CO2 29 11/28/2016 0824   GLUCOSE 95 11/28/2016 0824   BUN 18  11/28/2016 0824   BUN 17 06/04/2016 0814   CREATININE 1.29 11/28/2016 0824   CALCIUM 9.6 11/28/2016 0824   PROT 7.3 06/27/2016 0908   PROT 7.0 06/04/2016 0814   ALBUMIN 4.0 06/27/2016 0908   ALBUMIN 4.5 06/04/2016 0814   AST 14 06/27/2016 0908   ALT 12 06/27/2016 0908   ALKPHOS 62 06/27/2016 0908   BILITOT 0.4 06/27/2016 0908   BILITOT 0.3 06/04/2016 0814   GFRNONAA 68 06/04/2016 0814   GFRAA 78 06/04/2016 0814   Lab Results  Component Value Date   CHOL 164 06/27/2016   HDL 41.10 06/27/2016   LDLCALC 103 (H) 06/27/2016   TRIG 101.0 06/27/2016   CHOLHDL 4 06/27/2016   Lab Results  Component Value Date   HGBA1C 5.9 06/27/2016  ASSESSMENT AND PLAN 51 y.o. year old male  has a past medical history of Bilateral inguinal hernia; Hyperlipidemia; Hypertension; Multiple sclerosis ([place]); Nephrolithiasis; and Neuromuscular disorder ([place]). here with:  1. Multiple sclerosis  Overall the patient is doing well. He will continue on Gilenya. I will check blood work today. We will consider repeating imaging at the next visit. Patient is advised that if his symptoms worsen or he develops new symptoms he should let us know. He will follow-up in 6 months or sooner if needed.  I spent 15 minutes with the patient. 50% of this time was spent discussing his plan of care-including blood work and imaging.     Ward Givens, MSN, NP-C 03/04/2017, 7:41 AM [facility] [address] [place], [place] [address] [phone]

## 2017-03-04 NOTE — Progress Notes (Signed)
I have read the note, and I agree with the clinical assessment and plan.  Shanayah Kaffenberger KEITH   

## 2017-03-04 NOTE — Patient Instructions (Signed)
Continue Gilenya Blood work today If your symptoms worsen or you develop new symptoms please let us know.

## 2017-03-05 ENCOUNTER — Telehealth: Payer: Self-pay | Admitting: *Deleted

## 2017-03-05 LAB — CBC WITH DIFFERENTIAL/PLATELET
BASOS: 1 %
Basophils Absolute: 0 10*3/uL (ref 0.0–0.2)
EOS (ABSOLUTE): 0.3 10*3/uL (ref 0.0–0.4)
EOS: 7 %
HEMATOCRIT: 39.7 % (ref 37.5–51.0)
HEMOGLOBIN: 13.2 g/dL (ref 13.0–17.7)
IMMATURE GRANULOCYTES: 1 %
Immature Grans (Abs): 0.1 10*3/uL (ref 0.0–0.1)
LYMPHS ABS: 0.7 10*3/uL (ref 0.7–3.1)
Lymphs: 17 %
MCH: 29.7 pg (ref 26.6–33.0)
MCHC: 33.2 g/dL (ref 31.5–35.7)
MCV: 89 fL (ref 79–97)
MONOS ABS: 1 10*3/uL — AB (ref 0.1–0.9)
Monocytes: 22 %
NEUTROS PCT: 52 %
Neutrophils Absolute: 2.2 10*3/uL (ref 1.4–7.0)
Platelets: 221 10*3/uL (ref 150–379)
RBC: 4.45 x10E6/uL (ref 4.14–5.80)
RDW: 14 % (ref 12.3–15.4)
WBC: 4.3 10*3/uL (ref 3.4–10.8)

## 2017-03-05 LAB — COMPREHENSIVE METABOLIC PANEL
ALBUMIN: 4.3 g/dL (ref 3.5–5.5)
ALT: 30 IU/L (ref 0–44)
AST: 20 IU/L (ref 0–40)
Albumin/Globulin Ratio: 2 (ref 1.2–2.2)
Alkaline Phosphatase: 87 IU/L (ref 39–117)
BUN/Creatinine Ratio: 12 (ref 9–20)
BUN: 16 mg/dL (ref 6–24)
Bilirubin Total: 0.4 mg/dL (ref 0.0–1.2)
CO2: 26 mmol/L (ref 20–29)
CREATININE: 1.31 mg/dL — AB (ref 0.76–1.27)
Calcium: 9.2 mg/dL (ref 8.7–10.2)
Chloride: 100 mmol/L (ref 96–106)
GFR calc non Af Amer: 63 mL/min/{1.73_m2} (ref >59)
GFR, EST AFRICAN AMERICAN: 73 mL/min/{1.73_m2} (ref >59)
GLOBULIN, TOTAL: 2.2 g/dL (ref 1.5–4.5)
Glucose: 96 mg/dL (ref 65–99)
Potassium: 5.1 mmol/L (ref 3.5–5.2)
SODIUM: 141 mmol/L (ref 134–144)
Total Protein: 6.5 g/dL (ref 6.0–8.5)

## 2017-03-05 NOTE — Telephone Encounter (Signed)
-----   Message from Ward Givens, NP sent at 03/05/2017 10:11 AM EDT ----- Lab work unremarkable. Please call patient.

## 2017-03-05 NOTE — Telephone Encounter (Signed)
Called and LVM for pt about unremarkable labs per MM,NP note. Gave [facility] phone number if he has further questions or concerns.

## 2017-05-07 ENCOUNTER — Other Ambulatory Visit: Payer: Self-pay | Admitting: *Deleted

## 2017-05-07 MED ORDER — FINGOLIMOD HCL 0.5 MG PO CAPS: 1.0000 | ORAL_CAPSULE | Freq: Every day | ORAL | 1 refills | Status: DC

## 2017-07-18 ENCOUNTER — Encounter: Payer: Self-pay | Admitting: Family Medicine

## 2017-07-18 ENCOUNTER — Ambulatory Visit (INDEPENDENT_AMBULATORY_CARE_PROVIDER_SITE_OTHER): Payer: Managed Care, Other (non HMO) | Admitting: Family Medicine

## 2017-07-18 VITALS — BP 118/70 | HR 66 | Temp 98.4°F | Ht 65.0 in | Wt 139.0 lb

## 2017-07-18 DIAGNOSIS — L219 Seborrheic dermatitis, unspecified: Secondary | ICD-10-CM | POA: Diagnosis not present

## 2017-07-18 DIAGNOSIS — I10 Essential (primary) hypertension: Secondary | ICD-10-CM

## 2017-07-18 DIAGNOSIS — Z23 Encounter for immunization: Secondary | ICD-10-CM

## 2017-07-18 MED ORDER — HYDROCORTISONE 1 % EX LOTN: TOPICAL_LOTION | CUTANEOUS | 1 refills | Status: DC

## 2017-07-18 NOTE — Progress Notes (Signed)
Chief Complaint  Patient presents with  . Hypertension    Subjective [name] [name] is a 51 y.o. male who presents for hypertension follow up. He does monitor home blood pressures. Blood pressures ranging from 120-130's/70-80's on average. He is compliant with medications-Exforge 5-320 mg daily. Patient has these side effects of medication: none He is adhering to a healthy diet overall. Current exercise: lifting wts, cardio, boxing  He also has a history of skin irritation on his face.  He does not remember what it is called, however he would have scaly and irritated skin around his nasolabial folds and around his mouth.  He believes he was prescribed cortisone cream but does not remember the strength.  It gets worse during the cold months.  He is requesting a prescription.   Past Medical History:  Diagnosis Date  . Bilateral inguinal hernia   . Hyperlipidemia   . Hypertension   . Multiple sclerosis ([place])   . Nephrolithiasis   . Neuromuscular disorder ([place])    MS   Family History  Problem Relation Age of Onset  . Hypertension Father   . Colon cancer Unknown   . Heart attack Sister   . Cancer Maternal Grandfather        colon     Medications Current Outpatient Prescriptions on File Prior to Visit  Medication Sig Dispense Refill  . amLODipine-valsartan (EXFORGE) 5-320 MG tablet Take 1 tablet by mouth daily. 90 tablet 3  . Cholecalciferol (VITAMIN D PO) Take 5,000 Units by mouth daily.     . Fingolimod HCl (GILENYA) 0.5 MG CAPS Take 1 capsule (0.5 mg total) by mouth daily. 90 capsule 1  . gabapentin (NEURONTIN) 300 MG capsule TAKE 1 CAPSULE THREE TIMES A DAY 270 capsule 3  . Multiple Vitamin (MULTIVITAMIN WITH MINERALS) TABS Take 1 tablet by mouth daily.    . vitamin B-12 (CYANOCOBALAMIN) 1000 MCG tablet Take 1,000 mcg by mouth daily.     Allergies Allergies  Allergen Reactions  . Mold Extract [Trichophyton]     congestion  . Penicillins Other (See Comments)   Reaction in childhood. Does not remember.    Review of Systems Cardiovascular: no chest pain Respiratory:  no shortness of breath  Exam BP 118/70 (BP Location: Left Arm, Patient Position: Sitting, Cuff Size: Normal)   Pulse 66   Temp 98.4 F (36.9 C) (Oral)   Ht 5\' 5"  (1.651 m)   Wt 139 lb (63 kg)   SpO2 97%   BMI 23.13 kg/m  General:  well developed, well nourished, in no apparent distress Skin:  warm, no pallor or diaphoresis Eyes:  pupils equal and round, sclera anicteric without injection Heart :RRR, no murmurs, no bruits, no LE edema Lungs:  clear to auscultation, no accessory muscle use Psych: well oriented with normal range of affect and appropriate judgment/insight  Essential hypertension  Dermatitis, seborrheic - Plan: hydrocortisone 1 % lotion  Need for influenza vaccination - Plan: Flu Vaccine QUAD 6+ mos PF IM (Fluarix Quad PF)  Orders as above.  Continue current regimen of Exforge. Counseled on diet and exercise Admittedly, his skin is not that bad today.  We will try low potency topical steroid to use twice weekly, twice a day as needed.  He is encouraged to continue to use emollients. F/u in 6 mo for physical. The patient voiced understanding and agreement to the plan.  [place], DO 07/18/17  8:33 AM

## 2017-07-18 NOTE — Progress Notes (Signed)
Pre visit review using our clinic review tool, if applicable. No additional management support is needed unless otherwise documented below in the visit note. 

## 2017-07-18 NOTE — Patient Instructions (Signed)
Keep up the great work.   Check your blood pressure whenever you want now.  If the cream doesn't work, call your dermatologist and let me know what you were on before.   Let us know if you need anything.

## 2017-09-04 ENCOUNTER — Ambulatory Visit: Payer: Managed Care, Other (non HMO) | Admitting: Adult Health

## 2017-10-21 ENCOUNTER — Other Ambulatory Visit: Payer: Self-pay | Admitting: Neurology

## 2017-11-19 ENCOUNTER — Ambulatory Visit: Payer: Managed Care, Other (non HMO) | Admitting: Adult Health

## 2017-11-19 ENCOUNTER — Encounter: Payer: Self-pay | Admitting: Adult Health

## 2017-11-19 DIAGNOSIS — G35 Multiple sclerosis: Secondary | ICD-10-CM | POA: Diagnosis not present

## 2017-11-19 NOTE — Progress Notes (Signed)
I have read the note, and I agree with the clinical assessment and plan.  Jlyn Cerros K Teddy Pena   

## 2017-11-19 NOTE — Progress Notes (Signed)
PATIENT: [name] [name] DOB: [birth year]  REASON FOR VISIT: follow up HISTORY FROM: patient  HISTORY OF PRESENT ILLNESS: Today 11/19/17 [name] [name] is a 52 year old male with a history of multiple sclerosis.  He returns today for follow-up.  He remains on Gilenya and tolerating it well.  He denies any new numbness or weakness.  He denies any changes in his gait or balance.  Denies any changes with his bowel or bladder.  He does note sometimes he has a hard time initiating urination.  He does report that he has a physical coming up with his primary care provider.  He denies any change in vision.  Overall he feels that he is doing well.  Denies any new symptoms.  Returns today for evaluation.  HISTORY 03/04/17: [name] [name] is a 52 year old male with a history of multiple sclerosis. He returns today for follow-up. He is currently taking Gilenya and tolerating it well. He states that he is no longer having any of the shocklike sensations down the arms nor muscle spasms. He denies any changes with his gait or balance. Denies any new numbness or weakness. Denies any changes with his vision. He reports that he did see his ophthalmologist for his regular appointment and reports all was normal. Denies any changes with the bowels or bladder. He reports he is having some neck pain but feels this may be related to the waiting he is sleeping at night. He denies any new neurological symptoms. He returns today for an evaluation.  REVIEW OF SYSTEMS: Out of a complete 14 system review of symptoms, the patient complains only of the following symptoms, and all other reviewed systems are negative.  See HPI  ALLERGIES: Allergies  Allergen Reactions  . Mold Extract [Trichophyton]     congestion  . Penicillins Other (See Comments)    Reaction in childhood. Does not remember.    HOME MEDICATIONS: Outpatient Medications Prior to Visit  Medication Sig Dispense Refill  . amLODipine-valsartan (EXFORGE) 5-320 MG  tablet Take 1 tablet by mouth daily. 90 tablet 3  . Cholecalciferol (VITAMIN D PO) Take 5,000 Units by mouth daily.     Marland Kitchen gabapentin (NEURONTIN) 300 MG capsule TAKE 1 CAPSULE THREE TIMES A DAY 270 capsule 3  . GILENYA 0.5 MG CAPS TAKE 1 CAPSULE DAILY 90 capsule 1  . hydrocortisone 1 % lotion Apply area over face as needed twice per day, twice weekly as needed. 118 mL 1  . Multiple Vitamin (MULTIVITAMIN WITH MINERALS) TABS Take 1 tablet by mouth daily.    . vitamin B-12 (CYANOCOBALAMIN) 1000 MCG tablet Take 1,000 mcg by mouth daily.     Facility-Administered Medications Prior to Visit  Medication Dose Route Frequency Provider Last Rate Last Dose  . gadopentetate dimeglumine (MAGNEVIST) injection 14 mL  14 mL Intravenous Once PRN Kathrynn Ducking, MD        PAST MEDICAL HISTORY: Past Medical History:  Diagnosis Date  . Bilateral inguinal hernia   . Hyperlipidemia   . Hypertension   . Multiple sclerosis ([place])   . Nephrolithiasis   . Neuromuscular disorder ([place])    MS    PAST SURGICAL HISTORY: Past Surgical History:  Procedure Laterality Date  . HERNIA REPAIR  08/27/12   bilateral repair with mesh  . SQUAMOUS CELL CARCINOMA EXCISION     From the forehead  . VASECTOMY      FAMILY HISTORY: Family History  Problem Relation Age of Onset  . Hypertension Father   .  Colon cancer Unknown   . Cancer Maternal Grandfather        colon    SOCIAL HISTORY: Social History   Socioeconomic History  . Marital status: Married    Spouse name: Not on file  . Number of children: 3  . Years of education: College  . Highest education level: Not on file  Social Needs  . Financial resource strain: Not on file  . Food insecurity - worry: Not on file  . Food insecurity - inability: Not on file  . Transportation needs - medical: Not on file  . Transportation needs - non-medical: Not on file  Occupational History    Employer: [employer]  Tobacco Use  . Smoking status: Never Smoker  .  Smokeless tobacco: Never Used  Substance and Sexual Activity  . Alcohol use: Yes    Alcohol/week: 0.0 oz    Comment: OCC  . Drug use: No  . Sexual activity: Not on file  Other Topics Concern  . Not on file  Social History Narrative   Patient lives at home with his wife Lattie Haw) and two daughters.   Patient works full time.   Education college.   Right handed.   Caffeine three sodas daily.      PHYSICAL EXAM  Vitals:   11/19/17 0712  BP: 114/61  Pulse: 69  Weight: 136 lb (61.7 kg)   Body mass index is 22.63 kg/m.  Generalized: Well developed, in no acute distress   Neurological examination  Mentation: Alert oriented to time, place, history taking. Follows all commands speech and language fluent Cranial nerve II-XII: Pupils were equal round reactive to light. Extraocular movements were full, visual field were full on confrontational test. Facial sensation and strength were normal. Uvula tongue midline. Head turning and shoulder shrug  were normal and symmetric. Motor: The motor testing reveals 5 over 5 strength of all 4 extremities. Good symmetric motor tone is noted throughout.  Sensory: Sensory testing is intact to soft touch on all 4 extremities. No evidence of extinction is noted.  Coordination: Cerebellar testing reveals good finger-nose-finger and heel-to-shin bilaterally.  Gait and station: Gait is normal. Tandem gait is normal. Romberg is negative. No drift is seen.  Reflexes: Deep tendon reflexes are symmetric and normal bilaterally.   DIAGNOSTIC DATA (LABS, IMAGING, TESTING) - I reviewed patient records, labs, notes, testing and imaging myself where available.  Lab Results  Component Value Date   WBC 4.3 03/04/2017   HGB 13.2 03/04/2017   HCT 39.7 03/04/2017   MCV 89 03/04/2017   PLT 221 03/04/2017      Component Value Date/Time   NA 141 03/04/2017 0809   K 5.1 03/04/2017 0809   CL 100 03/04/2017 0809   CO2 26 03/04/2017 0809   GLUCOSE 96 03/04/2017  0809   GLUCOSE 95 11/28/2016 0824   BUN 16 03/04/2017 0809   CREATININE 1.31 (H) 03/04/2017 0809   CALCIUM 9.2 03/04/2017 0809   PROT 6.5 03/04/2017 0809   ALBUMIN 4.3 03/04/2017 0809   AST 20 03/04/2017 0809   ALT 30 03/04/2017 0809   ALKPHOS 87 03/04/2017 0809   BILITOT 0.4 03/04/2017 0809   GFRNONAA 63 03/04/2017 0809   GFRAA 73 03/04/2017 0809   Lab Results  Component Value Date   CHOL 164 06/27/2016   HDL 41.10 06/27/2016   LDLCALC 103 (H) 06/27/2016   TRIG 101.0 06/27/2016   CHOLHDL 4 06/27/2016   Lab Results  Component Value Date   HGBA1C 5.9 06/27/2016  ASSESSMENT AND PLAN 52 y.o. year old male  has a past medical history of Bilateral inguinal hernia, Hyperlipidemia, Hypertension, Multiple sclerosis ([place]), Nephrolithiasis, and Neuromuscular disorder ([place]). here with:  1.  Multiple sclerosis  Overall the patient is doing well.  He will continue on Gilenya.  I will check blood work.  We will repeat MRI of the brain and cervical spine to look for progression of MS.  His last imaging was in 2017.  He is advised that if his symptoms worsen or he develops new symptoms he should let us know.  He will follow-up in 6 months or sooner if needed.  I spent 15 minutes with the patient. 50% of this time was spent discussing his plan of care.     Ward Givens, MSN, NP-C 11/19/2017, 7:28 AM [facility] [address], [place], [place] [address] [phone]

## 2017-11-19 NOTE — Patient Instructions (Signed)
Your Plan:  Continue Gilenya Blood work today MRI brain and Cervical spine ordered If your symptoms worsen or you develop new symptoms please let us know.   Thank you for coming to see [place] at [facility]. I hope we have been able to provide you high quality care today.  You may receive a patient satisfaction survey over the next few weeks. We would appreciate your feedback and comments so that we may continue to improve ourselves and the health of our patients.

## 2017-11-20 ENCOUNTER — Telehealth: Payer: Self-pay | Admitting: *Deleted

## 2017-11-20 LAB — CBC WITH DIFFERENTIAL/PLATELET
BASOS ABS: 0 10*3/uL (ref 0.0–0.2)
Basos: 1 %
EOS (ABSOLUTE): 0.4 10*3/uL (ref 0.0–0.4)
Eos: 9 %
Hematocrit: 37.6 % (ref 37.5–51.0)
Hemoglobin: 12.1 g/dL — ABNORMAL LOW (ref 13.0–17.7)
IMMATURE GRANS (ABS): 0 10*3/uL (ref 0.0–0.1)
IMMATURE GRANULOCYTES: 1 %
LYMPHS: 15 %
Lymphocytes Absolute: 0.7 10*3/uL (ref 0.7–3.1)
MCH: 29.3 pg (ref 26.6–33.0)
MCHC: 32.2 g/dL (ref 31.5–35.7)
MCV: 91 fL (ref 79–97)
MONOS ABS: 0.7 10*3/uL (ref 0.1–0.9)
Monocytes: 15 %
NEUTROS PCT: 59 %
Neutrophils Absolute: 2.8 10*3/uL (ref 1.4–7.0)
PLATELETS: 242 10*3/uL (ref 150–379)
RBC: 4.13 x10E6/uL — ABNORMAL LOW (ref 4.14–5.80)
RDW: 13.7 % (ref 12.3–15.4)
WBC: 4.7 10*3/uL (ref 3.4–10.8)

## 2017-11-20 LAB — COMPREHENSIVE METABOLIC PANEL
A/G RATIO: 1.9 (ref 1.2–2.2)
ALK PHOS: 77 IU/L (ref 39–117)
ALT: 15 IU/L (ref 0–44)
AST: 15 IU/L (ref 0–40)
Albumin: 4.1 g/dL (ref 3.5–5.5)
BILIRUBIN TOTAL: 0.4 mg/dL (ref 0.0–1.2)
BUN/Creatinine Ratio: 14 (ref 9–20)
BUN: 16 mg/dL (ref 6–24)
CHLORIDE: 103 mmol/L (ref 96–106)
CO2: 26 mmol/L (ref 20–29)
Calcium: 9 mg/dL (ref 8.7–10.2)
Creatinine, Ser: 1.16 mg/dL (ref 0.76–1.27)
GFR calc Af Amer: 84 mL/min/{1.73_m2} (ref >59)
GFR calc non Af Amer: 73 mL/min/{1.73_m2} (ref >59)
GLOBULIN, TOTAL: 2.2 g/dL (ref 1.5–4.5)
Glucose: 94 mg/dL (ref 65–99)
Potassium: 4.6 mmol/L (ref 3.5–5.2)
SODIUM: 143 mmol/L (ref 134–144)
Total Protein: 6.3 g/dL (ref 6.0–8.5)

## 2017-11-20 NOTE — Telephone Encounter (Signed)
LVM informing patient that his blood work is relatively unremarkable. Left number for any questions.

## 2017-12-23 ENCOUNTER — Telehealth: Payer: Self-pay | Admitting: *Deleted

## 2017-12-23 NOTE — Telephone Encounter (Signed)
Pt parking placard on [address] desk

## 2017-12-24 NOTE — Telephone Encounter (Signed)
Called, LVM for pt to call. Need to clarify what he is requesting parking placard for. Was going to ask him if he having trouble ambulating, ambulating long distances? Gave [facility] phone number.

## 2017-12-25 NOTE — Telephone Encounter (Signed)
Called, LVM again for pt to call and discuss paperwork. Gave [facility] phone number.

## 2017-12-31 NOTE — Telephone Encounter (Signed)
Called Lattie Haw (wife) on [place]. Advised been trying to reach husbandx2 about paperwork that was dropped off. Have not heard back. Asking them to call us back. Gave [facility] phone number.

## 2018-01-06 NOTE — Telephone Encounter (Signed)
Pt called back. He states he is unable to walk long distances w/o his legs becoming numb/weak. Also c/o of them becoming hot with increased ambulation. He does not use an assistive device while ambulating. Advised I will speak with Dr. Jannifer Franklin and we will call once form ready for pick up. He verbalized understanding and appreciation for call.

## 2018-01-07 NOTE — Telephone Encounter (Signed)
Gave completed/signed forms back to medical records to process for pt.   

## 2018-01-08 ENCOUNTER — Telehealth: Payer: Self-pay | Admitting: *Deleted

## 2018-01-08 NOTE — Telephone Encounter (Signed)
Pt parking placard @ the front desk for p/u

## 2018-01-11 ENCOUNTER — Other Ambulatory Visit: Payer: Self-pay | Admitting: Family Medicine

## 2018-01-11 ENCOUNTER — Other Ambulatory Visit: Payer: Self-pay | Admitting: Neurology

## 2018-01-11 DIAGNOSIS — I10 Essential (primary) hypertension: Secondary | ICD-10-CM

## 2018-01-16 ENCOUNTER — Encounter: Payer: Managed Care, Other (non HMO) | Admitting: Family Medicine

## 2018-02-04 ENCOUNTER — Encounter: Payer: Self-pay | Admitting: Family Medicine

## 2018-02-04 ENCOUNTER — Ambulatory Visit (INDEPENDENT_AMBULATORY_CARE_PROVIDER_SITE_OTHER): Payer: Managed Care, Other (non HMO) | Admitting: Family Medicine

## 2018-02-04 VITALS — BP 104/78 | HR 66 | Temp 97.8°F | Ht 65.0 in | Wt 136.0 lb

## 2018-02-04 DIAGNOSIS — Z0000 Encounter for general adult medical examination without abnormal findings: Secondary | ICD-10-CM

## 2018-02-04 DIAGNOSIS — Z1211 Encounter for screening for malignant neoplasm of colon: Secondary | ICD-10-CM

## 2018-02-04 LAB — COMPREHENSIVE METABOLIC PANEL
ALBUMIN: 4.3 g/dL (ref 3.5–5.2)
ALK PHOS: 74 U/L (ref 39–117)
ALT: 19 U/L (ref 0–53)
AST: 16 U/L (ref 0–37)
BUN: 14 mg/dL (ref 6–23)
CO2: 29 mEq/L (ref 19–32)
CREATININE: 1.11 mg/dL (ref 0.40–1.50)
Calcium: 9.1 mg/dL (ref 8.4–10.5)
Chloride: 103 mEq/L (ref 96–112)
GFR: 74.4 mL/min (ref >60.00)
GLUCOSE: 97 mg/dL (ref 70–99)
POTASSIUM: 4.1 meq/L (ref 3.5–5.1)
SODIUM: 142 meq/L (ref 135–145)
TOTAL PROTEIN: 6.8 g/dL (ref 6.0–8.3)
Total Bilirubin: 0.4 mg/dL (ref 0.2–1.2)

## 2018-02-04 LAB — LIPID PANEL
CHOLESTEROL: 178 mg/dL (ref 0–200)
HDL: 39.8 mg/dL (ref >39.00)
LDL Cholesterol: 98 mg/dL (ref 0–99)
NONHDL: 137.75
Total CHOL/HDL Ratio: 4
Triglycerides: 197 mg/dL — ABNORMAL HIGH (ref 0.0–149.0)
VLDL: 39.4 mg/dL (ref 0.0–40.0)

## 2018-02-04 MED ORDER — LEVOCETIRIZINE DIHYDROCHLORIDE 5 MG PO TABS: 5.0000 mg | ORAL_TABLET | Freq: Every evening | ORAL | 5 refills | Status: DC

## 2018-02-04 NOTE — Progress Notes (Signed)
Pre visit review using our clinic review tool, if applicable. No additional management support is needed unless otherwise documented below in the visit note. 

## 2018-02-04 NOTE — Patient Instructions (Addendum)
If you do not hear anything about your referral in the next 1-2 weeks, call our office and ask for an update.  Consider a nasal spray like Flonase. Xyzal is also available over the counter. Replace Zyrtec with this.   Call your pharmacy to see about the availability of the new shingles vaccine (Shingrix).  Let me know over MyChart if you would like to have the PSA done for prostate cancer screening.  Keep up the good work!

## 2018-02-04 NOTE — Progress Notes (Signed)
Chief Complaint  Patient presents with  . Annual Exam    Well Male [name] [name] is here for a complete physical.   His last physical was >1 year ago.  Current diet: in general, a "healthy" diet.  Current exercise: 4-5 times per week Weight trend: stable Daytime fatigue? No. Seat belt? Yes.    Health maintenance Shingrix- No Colonoscopy- No Tetanus- Yes HIV- Yes Prostate cancer screening- No   Past Medical History:  Diagnosis Date  . Bilateral inguinal hernia   . Hyperlipidemia   . Hypertension   . Multiple sclerosis ([place])   . Nephrolithiasis   . Neuromuscular disorder [facility])    MS      Past Surgical History:  Procedure Laterality Date  . HERNIA REPAIR  08/27/12   bilateral repair with mesh  . SQUAMOUS CELL CARCINOMA EXCISION     From the forehead  . VASECTOMY      Medications  Current Outpatient Medications on File Prior to Visit  Medication Sig Dispense Refill  . amLODipine-valsartan (EXFORGE) 5-320 MG tablet TAKE 1 TABLET DAILY 90 tablet 3  . Cholecalciferol (VITAMIN D PO) Take 5,000 Units by mouth daily.     Marland Kitchen gabapentin (NEURONTIN) 300 MG capsule TAKE 1 CAPSULE THREE TIMES A DAY 270 capsule 3  . GILENYA 0.5 MG CAPS TAKE 1 CAPSULE DAILY 90 capsule 1  . hydrocortisone 1 % lotion Apply area over face as needed twice per day, twice weekly as needed. 118 mL 1  . Multiple Vitamin (MULTIVITAMIN WITH MINERALS) TABS Take 1 tablet by mouth daily.    . vitamin B-12 (CYANOCOBALAMIN) 1000 MCG tablet Take 1,000 mcg by mouth daily.     Allergies Allergies  Allergen Reactions  . Mold Extract [Trichophyton]     congestion  . Penicillins Other (See Comments)    Reaction in childhood. Does not remember.    Family History Family History  Problem Relation Age of Onset  . Hypertension Father   . Colon cancer Unknown   . Cancer Maternal Grandfather        colon    Review of Systems: Constitutional:  no fevers Eye:  no recent significant change in  vision Ear/Nose/Mouth/Throat:  Ears:  no hearing loss Nose/Mouth/Throat:  +nasal congestion, no sore throat Cardiovascular:  no chest pain, no palpitations Respiratory:  no cough and no shortness of breath Gastrointestinal:  no abdominal pain, no change in bowel habits GU:  Male: negative for dysuria, frequency, and incontinence and negative for prostate symptoms Musculoskeletal/Extremities:  no pain, redness, or swelling of the joints Integumentary (Skin/Breast):  no abnormal skin lesions reported Neurologic:  no headaches Endocrine: No unexpected weight changes Hematologic/Lymphatic:  no abnormal bleeding  Exam BP 104/78 (BP Location: Left Arm, Patient Position: Sitting, Cuff Size: Normal)   Pulse 66   Temp 97.8 F (36.6 C) (Oral)   Ht 5\' 5"  (1.651 m)   Wt 136 lb (61.7 kg)   SpO2 99%   BMI 22.63 kg/m  General:  well developed, well nourished, in no apparent distress Skin:  no significant moles, warts, or growths Head:  no masses, lesions, or tenderness Eyes:  pupils equal and round, sclera anicteric without injection Ears:  canals without lesions, TMs shiny without retraction, no obvious effusion, no erythema Nose:  nares patent, mild edema of turb on R, septum midline, mucosa normal Throat/Pharynx:  lips and gingiva without lesion; tongue and uvula midline; non-inflamed pharynx; no exudates or postnasal drainage Neck: neck supple without adenopathy, thyromegaly, or  masses Lungs:  clear to auscultation, breath sounds equal bilaterally, no respiratory distress Cardio:  regular rate and rhythm, no LE edema, no bruits Abdomen:  abdomen soft, nontender; bowel sounds normal; no masses or organomegaly Genital (male): circumcised penis, no lesions or discharge; testes present bilaterally without masses or tenderness Rectal: Deferred Musculoskeletal:  symmetrical muscle groups noted without atrophy or deformity Extremities:  no clubbing, cyanosis, or edema, no deformities, no skin  discoloration Neuro:  gait normal; deep tendon reflexes normal and symmetric Psych: well oriented with normal range of affect and appropriate judgment/insight  Assessment and Plan  Well adult exam - Plan: Lipid panel, Comprehensive metabolic panel  Screen for colon cancer - Plan: Ambulatory referral to Gastroenterology   Well 52 y.o. male. Counseled on diet and exercise. Stop Zyrtec, start Xyzal, consider INCS. Could try Singulair if no improvement. Call pharm regarding Shingrix.  Counseled on risks and benefits of prostate cancer screening with PSA. The patient agrees to forego testing. Will let us know via MyChart if he changes his mind with this.  Immunizations, labs, and further orders as above. Follow up in 6 mo or prn. The patient voiced understanding and agreement to the plan.  [place], DO 02/04/18 2:27 PM

## 2018-02-14 ENCOUNTER — Encounter: Payer: Self-pay | Admitting: Family Medicine

## 2018-02-14 DIAGNOSIS — J342 Deviated nasal septum: Secondary | ICD-10-CM

## 2018-04-07 ENCOUNTER — Encounter: Payer: Self-pay | Admitting: Family Medicine

## 2018-04-10 ENCOUNTER — Encounter: Payer: Self-pay | Admitting: Family Medicine

## 2018-04-18 ENCOUNTER — Other Ambulatory Visit: Payer: Self-pay | Admitting: Family Medicine

## 2018-04-18 MED ORDER — LEVOCETIRIZINE DIHYDROCHLORIDE 5 MG PO TABS: 5.0000 mg | ORAL_TABLET | Freq: Every evening | ORAL | 1 refills | Status: DC

## 2018-04-25 ENCOUNTER — Telehealth: Payer: Self-pay | Admitting: *Deleted

## 2018-04-25 MED ORDER — FINGOLIMOD HCL 0.5 MG PO CAPS: 1.0000 | ORAL_CAPSULE | Freq: Every day | ORAL | 1 refills | Status: DC

## 2018-04-25 NOTE — Telephone Encounter (Signed)
Gilenya refilled x 6 months to [facility].

## 2018-05-19 ENCOUNTER — Ambulatory Visit: Payer: Managed Care, Other (non HMO) | Admitting: Adult Health

## 2018-05-30 HISTORY — PX: NASAL SEPTUM SURGERY: SHX37

## 2018-07-08 ENCOUNTER — Encounter: Payer: Self-pay | Admitting: Gastroenterology

## 2018-07-16 ENCOUNTER — Ambulatory Visit (AMBULATORY_SURGERY_CENTER): Payer: Self-pay | Admitting: *Deleted

## 2018-07-16 VITALS — Ht 65.0 in | Wt 134.0 lb

## 2018-07-16 DIAGNOSIS — Z1211 Encounter for screening for malignant neoplasm of colon: Secondary | ICD-10-CM

## 2018-07-16 NOTE — Progress Notes (Signed)
Patient denies any allergies to eggs or soy. Patient denies any problems with anesthesia/sedation. Patient denies any oxygen use at home. Patient denies taking any diet/weight loss medications or blood thinners. EMMI education assisgned to patient on colonoscopy, this was explained and instructions given to patient. 

## 2018-07-17 ENCOUNTER — Encounter: Payer: Self-pay | Admitting: Gastroenterology

## 2018-07-30 ENCOUNTER — Ambulatory Visit (AMBULATORY_SURGERY_CENTER): Payer: Managed Care, Other (non HMO) | Admitting: Gastroenterology

## 2018-07-30 ENCOUNTER — Other Ambulatory Visit: Payer: Self-pay

## 2018-07-30 ENCOUNTER — Encounter: Payer: Self-pay | Admitting: Gastroenterology

## 2018-07-30 VITALS — BP 108/75 | HR 73 | Temp 98.2°F | Resp 18 | Ht 65.0 in | Wt 134.0 lb

## 2018-07-30 DIAGNOSIS — Z1211 Encounter for screening for malignant neoplasm of colon: Secondary | ICD-10-CM

## 2018-07-30 DIAGNOSIS — Z8371 Family history of colonic polyps: Secondary | ICD-10-CM

## 2018-07-30 DIAGNOSIS — D125 Benign neoplasm of sigmoid colon: Secondary | ICD-10-CM

## 2018-07-30 MED ORDER — SODIUM CHLORIDE 0.9 % IV SOLN: 500.0000 mL | Freq: Once | INTRAVENOUS | Status: DC

## 2018-07-30 NOTE — Progress Notes (Signed)
To recovery, report to RN, VSS. 

## 2018-07-30 NOTE — Patient Instructions (Signed)
YOU HAD AN ENDOSCOPIC PROCEDURE TODAY AT THE [facility]:   Refer to the procedure report that was given to you for any specific questions about what was found during the examination.  If the procedure report does not answer your questions, please call your gastroenterologist to clarify.  If you requested that your care partner not be given the details of your procedure findings, then the procedure report has been included in a sealed envelope for you to review at your convenience later.  YOU SHOULD EXPECT: Some feelings of bloating in the abdomen. Passage of more gas than usual.  Walking can help get rid of the air that was put into your GI tract during the procedure and reduce the bloating. If you had a lower endoscopy (such as a colonoscopy or flexible sigmoidoscopy) you may notice spotting of blood in your stool or on the toilet paper. If you underwent a bowel prep for your procedure, you may not have a normal bowel movement for a few days.  Please Note:  You might notice some irritation and congestion in your nose or some drainage.  This is from the oxygen used during your procedure.  There is no need for concern and it should clear up in a day or so.  SYMPTOMS TO REPORT IMMEDIATELY:   Following lower endoscopy (colonoscopy or flexible sigmoidoscopy):  Excessive amounts of blood in the stool  Significant tenderness or worsening of abdominal pains  Swelling of the abdomen that is new, acute  Fever of 100F or higher  Please see handouts given to you on Polyps.  For urgent or emergent issues, a gastroenterologist can be reached at any hour by calling [phone].   DIET:  We do recommend a small meal at first, but then you may proceed to your regular diet.  Drink plenty of fluids but you should avoid alcoholic beverages for 24 hours.  ACTIVITY:  You should plan to take it easy for the rest of today and you should NOT DRIVE or use heavy machinery until tomorrow (because of the  sedation medicines used during the test).    FOLLOW UP: Our staff will call the number listed on your records the next business day following your procedure to check on you and address any questions or concerns that you may have regarding the information given to you following your procedure. If we do not reach you, we will leave a message.  However, if you are feeling well and you are not experiencing any problems, there is no need to return our call.  We will assume that you have returned to your regular daily activities without incident.  If any biopsies were taken you will be contacted by phone or by letter within the next 1-3 weeks.  Please call us at [phone] if you have not heard about the biopsies in 3 weeks.    SIGNATURES/CONFIDENTIALITY: You and/or your care partner have signed paperwork which will be entered into your electronic medical record.  These signatures attest to the fact that that the information above on your After Visit Summary has been reviewed and is understood.  Full responsibility of the confidentiality of this discharge information lies with you and/or your care-partner.  Thank you for letting us take care of your healthcare needs today. 

## 2018-07-30 NOTE — Progress Notes (Signed)
Pt's states no medical or surgical changes since previsit or office visit. 

## 2018-07-30 NOTE — Progress Notes (Signed)
Called to room to assist during endoscopic procedure.  Patient ID and intended procedure confirmed with present staff. Received instructions for my participation in the procedure from the performing physician.  

## 2018-07-30 NOTE — Op Note (Signed)
[place] Patient Name: [name] [name] Procedure Date: 07/30/2018 2:23 PM MRN: [id number] Endoscopist: Thornton Park MD, MD Age: 52 Referring MD:  Date of Birth: 15-Jun-1966 Gender: Male Account #: [id number] Procedure:                Colonoscopy Indications:              Screening for colorectal malignant neoplasm. Father                            with precancerous polyps first removed in his 64s.                            No other known family history of colon cancer or                            polyps. No baseline GI symptoms. Medicines:                See the Anesthesia note for documentation of the                            administered medications Procedure:                Pre-Anesthesia Assessment:                           - Prior to the procedure, a History and Physical                            was performed, and patient medications and                            allergies were reviewed. The patient's tolerance of                            previous anesthesia was also reviewed. The risks                            and benefits of the procedure and the sedation                            options and risks were discussed with the patient.                            All questions were answered, and informed consent                            was obtained. Prior Anticoagulants: The patient has                            taken no previous anticoagulant or antiplatelet                            agents. ASA Grade Assessment: III - A patient with  severe systemic disease. After reviewing the risks                            and benefits, the patient was deemed in                            satisfactory condition to undergo the procedure.                           After obtaining informed consent, the colonoscope                            was passed under direct vision. Throughout the                            procedure, the patient's  blood pressure, pulse, and                            oxygen saturations were monitored continuously. The                            Colonoscope was introduced through the anus and                            advanced to the the terminal ileum, with                            identification of the appendiceal orifice and IC                            valve. The colonoscopy was performed without                            difficulty. The patient tolerated the procedure                            well. The quality of the bowel preparation was good. Scope In: 2:29:40 PM Scope Out: 2:41:20 PM Scope Withdrawal Time: 0 hours 9 minutes 24 seconds  Total Procedure Duration: 0 hours 11 minutes 40 seconds  Findings:                 Skin tags were found on perianal exam.                           A 2 mm polyp was found in the distal sigmoid colon.                            The polyp was sessile. The polyp was removed with a                            cold biopsy forceps. Resection and retrieval were                            complete.  The exam was otherwise without abnormality on                            direct and retroflexion views. Complications:            No immediate complications. Estimated Blood Loss:     Estimated blood loss: none. Impression:               - Perianal skin tags found on perianal exam.                           - One 2 mm polyp in the distal sigmoid colon,                            removed with a cold biopsy forceps. Resected and                            retrieved.                           - The examination was otherwise normal on direct                            and retroflexion views. Recommendation:           - Discharge patient to home.                           - Resume regular diet.                           - Continue present medications.                           - Await pathology results.                           - Repeat  colonoscopy in 5 years for surveillance if                            the polyp is adenomatous. Thornton Park MD, MD 07/30/2018 2:44:56 PM This report has been signed electronically.

## 2018-07-31 ENCOUNTER — Telehealth: Payer: Self-pay | Admitting: *Deleted

## 2018-07-31 ENCOUNTER — Telehealth: Payer: Self-pay

## 2018-07-31 NOTE — Telephone Encounter (Signed)
  Follow up Call-  Call back number 07/30/2018  Post procedure Call Back phone  # [phone]  Permission to leave phone message Yes  Some recent data might be hidden     Patient questions:  Do you have a fever, pain , or abdominal swelling? No. Pain Score  0 *  Have you tolerated food without any problems? Yes.    Have you been able to return to your normal activities? Yes.    Do you have any questions about your discharge instructions: Diet   No. Medications  No. Follow up visit  No.  Do you have questions or concerns about your Care? No.  Actions: * If pain score is 4 or above: No action needed, pain <4.

## 2018-07-31 NOTE — Telephone Encounter (Signed)
  Follow up Call-  Call back number 07/30/2018  Post procedure Call Back phone  # [phone]  Permission to leave phone message Yes  Some recent data might be hidden     Patient questions:  Do you have a fever, pain , or abdominal swelling? No. Pain Score  0 *  Have you tolerated food without any problems? Yes.    Have you been able to return to your normal activities? Yes.    Do you have any questions about your discharge instructions: Diet   No. Medications  No. Follow up visit  No.  Do you have questions or concerns about your Care? No.  Actions: * If pain score is 4 or above: No action needed, pain <4.

## 2018-08-06 ENCOUNTER — Encounter: Payer: Self-pay | Admitting: Gastroenterology

## 2018-08-07 ENCOUNTER — Encounter: Payer: Self-pay | Admitting: Family Medicine

## 2018-08-07 ENCOUNTER — Ambulatory Visit (INDEPENDENT_AMBULATORY_CARE_PROVIDER_SITE_OTHER): Payer: Managed Care, Other (non HMO) | Admitting: Family Medicine

## 2018-08-07 VITALS — BP 110/72 | HR 63 | Temp 98.2°F | Ht 64.0 in | Wt 130.2 lb

## 2018-08-07 DIAGNOSIS — I10 Essential (primary) hypertension: Secondary | ICD-10-CM | POA: Diagnosis not present

## 2018-08-07 DIAGNOSIS — D489 Neoplasm of uncertain behavior, unspecified: Secondary | ICD-10-CM

## 2018-08-07 DIAGNOSIS — R432 Parageusia: Secondary | ICD-10-CM | POA: Diagnosis not present

## 2018-08-07 MED ORDER — FLUTICASONE PROPIONATE 50 MCG/ACT NA SUSP: 2.0000 | Freq: Every day | NASAL | 6 refills | Status: DC

## 2018-08-07 NOTE — Patient Instructions (Addendum)
Use the nasal spray daily, 2 sprays each side. Call allergist in 1 mo if not improving.  Keep the diet clean and stay active.  Do not shower for the rest of the day. When you do wash it, use only soap and water. Do not vigorously scrub. Apply triple antibiotic ointment (like Neosporin) twice daily. Keep the area clean and dry.   Things to look out for: increasing pain not relieved by ibuprofen/acetaminophen, fevers, spreading redness, drainage of pus, or foul odor.  Give [place] 1 business week to get the results of your biopsy back.  Let us know if you need anything.

## 2018-08-07 NOTE — Addendum Note (Signed)
Addended by: Sharon Seller B on: 08/07/2018 01:45 PM   Modules accepted: Orders

## 2018-08-07 NOTE — Progress Notes (Signed)
Pre visit review using our clinic review tool, if applicable. No additional management support is needed unless otherwise documented below in the visit note. 

## 2018-08-07 NOTE — Progress Notes (Signed)
Chief Complaint  Patient presents with  . Follow-up    Subjective [name] [name] is a 52 y.o. male who presents for hypertension follow up. He does not monitor home blood pressures. He is compliant with medication- Exforge 5-320 mg/d. Patient has these side effects of medication: none He is adhering to a healthy diet overall. Current exercise: goes to [place]  Hx of loss of taste. Had a septoplasty that did help with things. Was using Xyzal without relief. Does see an allergist, but has not been recently.  1 mo, has had an area on his head that will scale/scab and never fully heal. No specific inj, +hx of skin CA.    Past Medical History:  Diagnosis Date  . Bilateral inguinal hernia   . Hyperlipidemia   . Hypertension   . Multiple sclerosis ([place])   . Nephrolithiasis   . Neuromuscular disorder ([place])    MS    Review of Systems Cardiovascular: no chest pain Respiratory:  no shortness of breath  Exam BP 110/72 (BP Location: Left Arm, Patient Position: Sitting, Cuff Size: Normal)   Pulse 63   Temp 98.2 F (36.8 C) (Oral)   Ht 5\' 4"  (1.626 m)   Wt 130 lb 4 oz (59.1 kg)   SpO2 98%   BMI 22.36 kg/m  General:  well developed, well nourished, in no apparent distress HEENT: PERRLA, EOMi, Nares patent w/o rhinorrhea, MMM, no exudate or erythema in pharynx Heart: RRR, no bruits, no LE edema Lungs: clear to auscultation, no accessory muscle use Skin: See below Psych: well oriented with normal range of affect and appropriate judgment/insight   L posterior scalp  Procedure note; shave biopsy Informed consent was obtained. The area, 0.7 x 0.5 cm, was cleaned with alcohol and injected with 0.5 mL of 1% lidocaine with epinephrine. A Dermablade was slightly bent and used to cut under the area of interest. The specimen was placed in a sterile specimen cup and sent to the lab. The area was then cauterized ensuring adequate hemostasis. The area was dressed with  triple antibiotic ointment and a bandage. There were no complications noted. The patient tolerated the procedure well.  Essential hypertension  Dysgeusia - Plan: fluticasone (FLONASE) 50 MCG/ACT nasal spray  Neoplasm of uncertain behavior - Plan: PR SHAV SKIN LES 0.6-1.0 CM REMAINDER BODY  #1- cont Exforge, staying active and keeping diet clean. #2- INCS for 2 mo. If no improvement, call allergist #3- I do not like the appearance or the story of his skin lesion. Will biopsy today. Aftercare instructions verbalized and written down, expect bx results in 1 week.  F/u in 6 mo for CPE or prn. The patient voiced understanding and agreement to the plan.  [place], DO 08/07/18  1:17 PM

## 2018-08-12 ENCOUNTER — Encounter: Payer: Self-pay | Admitting: Family Medicine

## 2018-08-13 ENCOUNTER — Telehealth: Payer: Self-pay | Admitting: *Deleted

## 2018-08-13 ENCOUNTER — Other Ambulatory Visit: Payer: Self-pay | Admitting: Family Medicine

## 2018-08-13 DIAGNOSIS — D044 Carcinoma in situ of skin of scalp and neck: Secondary | ICD-10-CM

## 2018-08-13 DIAGNOSIS — D099 Carcinoma in situ, unspecified: Secondary | ICD-10-CM

## 2018-08-13 NOTE — Progress Notes (Signed)
Derm referral placed

## 2018-08-13 NOTE — Telephone Encounter (Signed)
Received Dermatopathology Report results from [facility]; forwarded to provider/SLS 11/20

## 2018-09-05 ENCOUNTER — Other Ambulatory Visit: Payer: Self-pay | Admitting: Family Medicine

## 2018-09-05 DIAGNOSIS — R432 Parageusia: Secondary | ICD-10-CM

## 2018-09-05 MED ORDER — FLUTICASONE PROPIONATE 50 MCG/ACT NA SUSP: 2.0000 | Freq: Every day | NASAL | 6 refills | Status: DC

## 2018-09-11 ENCOUNTER — Ambulatory Visit (INDEPENDENT_AMBULATORY_CARE_PROVIDER_SITE_OTHER): Payer: Managed Care, Other (non HMO) | Admitting: Adult Health

## 2018-09-11 ENCOUNTER — Encounter: Payer: Self-pay | Admitting: Adult Health

## 2018-09-11 VITALS — BP 138/75 | HR 72 | Ht 64.0 in | Wt 132.6 lb

## 2018-09-11 DIAGNOSIS — Z5181 Encounter for therapeutic drug level monitoring: Secondary | ICD-10-CM | POA: Diagnosis not present

## 2018-09-11 DIAGNOSIS — G35 Multiple sclerosis: Secondary | ICD-10-CM | POA: Diagnosis not present

## 2018-09-11 NOTE — Progress Notes (Signed)
PATIENT: [name] [name] DOB: March 14, 1966  REASON FOR VISIT: follow up HISTORY FROM: patient  HISTORY OF PRESENT ILLNESS: Today 09/11/18 [name] [name] is a 52 year old male with a history of multiple sclerosis.  He returns today for follow-up.  He remains on Gilenya and is tolerating it well.  He was scheduled to have MRI of the brain and cervical spine at the last visit he reports that he forgot to call [facility] imaging.  He has not completed the scans yet.  He denies any new numbness or weakness.  No change in his gait or balance.  No change in his vision.  No change in the bowels or bladder.  He returns today for evaluation.  HISTORY 11/19/17 [name] [name] is a 52 year old male with a history of multiple sclerosis.  He returns today for follow-up.  He remains on Gilenya and tolerating it well.  He denies any new numbness or weakness.  He denies any changes in his gait or balance.  Denies any changes with his bowel or bladder.  He does note sometimes he has a hard time initiating urination.  He does report that he has a physical coming up with his primary care provider.  He denies any change in vision.  Overall he feels that he is doing well.  Denies any new symptoms.  Returns today for evaluation.  REVIEW OF SYSTEMS: Out of a complete 14 system review of symptoms, the patient complains only of the following symptoms, and all other reviewed systems are negative.  Numbness, neck pain, chills, environmental allergies  ALLERGIES: Allergies  Allergen Reactions  . Mold Extract [Trichophyton]     congestion  . Penicillins Other (See Comments)    Reaction in childhood. Does not remember.    HOME MEDICATIONS: Outpatient Medications Prior to Visit  Medication Sig Dispense Refill  . amLODipine-valsartan (EXFORGE) 5-320 MG tablet TAKE 1 TABLET DAILY 90 tablet 3  . Cholecalciferol (VITAMIN D PO) Take 5,000 Units by mouth daily.     . Fingolimod HCl (GILENYA) 0.5 MG CAPS Take 1 capsule (0.5 mg  total) by mouth daily. 90 capsule 1  . fluticasone (FLONASE) 50 MCG/ACT nasal spray Place 2 sprays into both nostrils daily. 48 g 6  . gabapentin (NEURONTIN) 300 MG capsule TAKE 1 CAPSULE THREE TIMES A DAY 270 capsule 3  . Multiple Vitamin (MULTIVITAMIN WITH MINERALS) TABS Take 1 tablet by mouth daily.    . vitamin B-12 (CYANOCOBALAMIN) 1000 MCG tablet Take 1,000 mcg by mouth daily.     Facility-Administered Medications Prior to Visit  Medication Dose Route Frequency Provider Last Rate Last Dose  . gadopentetate dimeglumine (MAGNEVIST) injection 14 mL  14 mL Intravenous Once PRN Kathrynn Ducking, MD        PAST MEDICAL HISTORY: Past Medical History:  Diagnosis Date  . Bilateral inguinal hernia   . Hyperlipidemia   . Hypertension   . Multiple sclerosis ([place])   . Nephrolithiasis   . Neuromuscular disorder ([place])    MS    PAST SURGICAL HISTORY: Past Surgical History:  Procedure Laterality Date  . HERNIA REPAIR  08/27/12   bilateral repair with mesh  . NASAL SEPTUM SURGERY  05/30/2018  . SQUAMOUS CELL CARCINOMA EXCISION     From the forehead  . VASECTOMY      FAMILY HISTORY: Family History  Problem Relation Age of Onset  . Hypertension Father   . Colon polyps Father   . Cancer Maternal Grandfather  colon  . Colon cancer Neg Hx   . Esophageal cancer Neg Hx   . Rectal cancer Neg Hx   . Stomach cancer Neg Hx     SOCIAL HISTORY: Social History   Socioeconomic History  . Marital status: Married    Spouse name: Not on file  . Number of children: 3  . Years of education: College  . Highest education level: Not on file  Occupational History    Employer: [employer]  . Financial resource strain: Not on file  . Food insecurity:    Worry: Not on file    Inability: Not on file  . Transportation needs:    Medical: Not on file    Non-medical: Not on file  Tobacco Use  . Smoking status: Never Smoker  . Smokeless tobacco: Never Used  Substance and Sexual  Activity  . Alcohol use: Yes    Alcohol/week: 0.0 standard drinks    Comment: OCC  . Drug use: No  . Sexual activity: Not on file  Lifestyle  . Physical activity:    Days per week: Not on file    Minutes per session: Not on file  . Stress: Not on file  Relationships  . Social connections:    Talks on phone: Not on file    Gets together: Not on file    Attends religious service: Not on file    Active member of club or organization: Not on file    Attends meetings of clubs or organizations: Not on file    Relationship status: Not on file  . Intimate partner violence:    Fear of current or ex partner: Not on file    Emotionally abused: Not on file    Physically abused: Not on file    Forced sexual activity: Not on file  Other Topics Concern  . Not on file  Social History Narrative   Patient lives at home with his wife Lattie Haw) and two daughters.   Patient works full time.   Education college.   Right handed.   Caffeine three sodas daily.      PHYSICAL EXAM  Vitals:   09/11/18 0850  BP: 138/75  Pulse: 72  Weight: 132 lb 9.6 oz (60.1 kg)  Height: 5\' 4"  (1.626 m)   Body mass index is 22.76 kg/m.  Generalized: Well developed, in no acute distress   Neurological examination  Mentation: Alert oriented to time, place, history taking. Follows all commands speech and language fluent Cranial nerve II-XII: Pupils were equal round reactive to light. Extraocular movements were full, visual field were full on confrontational test. Facial sensation and strength were normal. Uvula tongue midline. Head turning and shoulder shrug  were normal and symmetric. Motor: The motor testing reveals 5 over 5 strength of all 4 extremities. Good symmetric motor tone is noted throughout.  Sensory: Sensory testing is intact to soft touch on all 4 extremities. No evidence of extinction is noted.  Coordination: Cerebellar testing reveals good finger-nose-finger and heel-to-shin bilaterally.  Gait  and station: Gait is normal. Tandem gait is normal. Romberg is negative. No drift is seen.  Reflexes: Deep tendon reflexes are symmetric and normal bilaterally.   DIAGNOSTIC DATA (LABS, IMAGING, TESTING) - I reviewed patient records, labs, notes, testing and imaging myself where available.  Lab Results  Component Value Date   WBC 4.7 11/19/2017   HGB 12.1 (L) 11/19/2017   HCT 37.6 11/19/2017   MCV 91 11/19/2017   PLT 242 11/19/2017  Component Value Date/Time   NA 142 02/04/2018 1429   NA 143 11/19/2017 0810   K 4.1 02/04/2018 1429   CL 103 02/04/2018 1429   CO2 29 02/04/2018 1429   GLUCOSE 97 02/04/2018 1429   BUN 14 02/04/2018 1429   BUN 16 11/19/2017 0810   CREATININE 1.11 02/04/2018 1429   CALCIUM 9.1 02/04/2018 1429   PROT 6.8 02/04/2018 1429   PROT 6.3 11/19/2017 0810   ALBUMIN 4.3 02/04/2018 1429   ALBUMIN 4.1 11/19/2017 0810   AST 16 02/04/2018 1429   ALT 19 02/04/2018 1429   ALKPHOS 74 02/04/2018 1429   BILITOT 0.4 02/04/2018 1429   BILITOT 0.4 11/19/2017 0810   GFRNONAA 73 11/19/2017 0810   GFRAA 84 11/19/2017 0810   Lab Results  Component Value Date   CHOL 178 02/04/2018   HDL 39.80 02/04/2018   LDLCALC 98 02/04/2018   TRIG 197.0 (H) 02/04/2018   CHOLHDL 4 02/04/2018   Lab Results  Component Value Date   HGBA1C 5.9 06/27/2016   No results found for: VITAMINB12 No results found for: TSH    ASSESSMENT AND PLAN 53 y.o. year old male  has a past medical history of Bilateral inguinal hernia, Hyperlipidemia, Hypertension, Multiple sclerosis ([place]), Nephrolithiasis, and Neuromuscular disorder ([place]). here with :  1.  Multiple sclerosis  The patient's physical exam is relatively unremarkable.  He is encouraged to continue on Gilenya.  I will check blood work today.  He was advised to call [facility] imaging and have his MRI scheduled.  He is advised that if his symptoms worsen or he develops new symptoms he should let us know.  He will follow-up in 6  months or sooner if needed. I spent 15 minutes with the patient. 50% of this time was spent   Ward Givens, MSN, NP-C 09/11/2018, 9:09 AM [facility] [address] [place], [place] [address] [phone]

## 2018-09-11 NOTE — Progress Notes (Signed)
I have read the note, and I agree with the clinical assessment and plan.  [name] [name]   

## 2018-09-11 NOTE — Patient Instructions (Signed)
Your Plan:  Continue Gilenya Blood work today  [facility] to schedule MRI If your symptoms worsen or you develop new symptoms please let us know.   Thank you for coming to see [place] at [facility]. I hope we have been able to provide you high quality care today.  You may receive a patient satisfaction survey over the next few weeks. We would appreciate your feedback and comments so that we may continue to improve ourselves and the health of our patients.

## 2018-09-12 ENCOUNTER — Encounter: Payer: Self-pay | Admitting: *Deleted

## 2018-09-12 LAB — COMPREHENSIVE METABOLIC PANEL
ALK PHOS: 99 IU/L (ref 39–117)
ALT: 19 IU/L (ref 0–44)
AST: 12 IU/L (ref 0–40)
Albumin/Globulin Ratio: 1.9 (ref 1.2–2.2)
Albumin: 4.4 g/dL (ref 3.5–5.5)
BUN/Creatinine Ratio: 16 (ref 9–20)
BUN: 17 mg/dL (ref 6–24)
Bilirubin Total: 0.3 mg/dL (ref 0.0–1.2)
CO2: 27 mmol/L (ref 20–29)
CREATININE: 1.09 mg/dL (ref 0.76–1.27)
Calcium: 9.6 mg/dL (ref 8.7–10.2)
Chloride: 102 mmol/L (ref 96–106)
GFR calc Af Amer: 90 mL/min/{1.73_m2} (ref >59)
GFR calc non Af Amer: 78 mL/min/{1.73_m2} (ref >59)
GLUCOSE: 107 mg/dL — AB (ref 65–99)
Globulin, Total: 2.3 g/dL (ref 1.5–4.5)
Potassium: 4.3 mmol/L (ref 3.5–5.2)
SODIUM: 142 mmol/L (ref 134–144)
Total Protein: 6.7 g/dL (ref 6.0–8.5)

## 2018-09-12 LAB — CBC WITH DIFFERENTIAL/PLATELET
BASOS ABS: 0.1 10*3/uL (ref 0.0–0.2)
Basos: 1 %
EOS (ABSOLUTE): 0.5 10*3/uL — ABNORMAL HIGH (ref 0.0–0.4)
EOS: 11 %
HEMATOCRIT: 40.6 % (ref 37.5–51.0)
Hemoglobin: 13.1 g/dL (ref 13.0–17.7)
Immature Grans (Abs): 0 10*3/uL (ref 0.0–0.1)
Immature Granulocytes: 1 %
LYMPHS ABS: 0.8 10*3/uL (ref 0.7–3.1)
Lymphs: 18 %
MCH: 29.1 pg (ref 26.6–33.0)
MCHC: 32.3 g/dL (ref 31.5–35.7)
MCV: 90 fL (ref 79–97)
MONOS ABS: 0.7 10*3/uL (ref 0.1–0.9)
Monocytes: 15 %
NEUTROS PCT: 54 %
Neutrophils Absolute: 2.5 10*3/uL (ref 1.4–7.0)
PLATELETS: 280 10*3/uL (ref 150–450)
RBC: 4.5 x10E6/uL (ref 4.14–5.80)
RDW: 12.7 % (ref 12.3–15.4)
WBC: 4.6 10*3/uL (ref 3.4–10.8)

## 2018-09-15 ENCOUNTER — Telehealth: Payer: Self-pay | Admitting: Adult Health

## 2018-09-15 NOTE — Telephone Encounter (Signed)
[name] [name]: [id number] (exp 09/12/18 to 12/11/18) patient is scheduled at GI for 09/21/18

## 2018-09-21 ENCOUNTER — Ambulatory Visit
Admission: RE | Admit: 2018-09-21 | Discharge: 2018-09-21 | Disposition: A | Discharge status: Home / Self Care | Payer: Managed Care, Other (non HMO) | Source: Ambulatory Visit | Attending: Adult Health | Admitting: Adult Health

## 2018-09-21 DIAGNOSIS — G35 Multiple sclerosis: Secondary | ICD-10-CM

## 2018-09-21 MED ORDER — GADOBENATE DIMEGLUMINE 529 MG/ML IV SOLN
10.0000 mL | Freq: Once | INTRAVENOUS | Status: AC | PRN
  Administered 2018-09-21: 10 mL via INTRAVENOUS

## 2018-09-22 ENCOUNTER — Other Ambulatory Visit: Payer: Self-pay | Admitting: Family Medicine

## 2018-09-25 ENCOUNTER — Telehealth: Payer: Self-pay | Admitting: *Deleted

## 2018-09-25 NOTE — Telephone Encounter (Signed)
Spoke with patient and informed him that his MRI brain and MRI cervical spine scans are stable when compared to the previous MRI scans. He verbalized understanding, appreciation.

## 2018-10-22 DIAGNOSIS — C4442 Squamous cell carcinoma of skin of scalp and neck: Secondary | ICD-10-CM | POA: Diagnosis not present

## 2018-10-28 ENCOUNTER — Other Ambulatory Visit: Payer: Self-pay | Admitting: Neurology

## 2018-12-20 ENCOUNTER — Other Ambulatory Visit: Payer: Self-pay | Admitting: Family Medicine

## 2018-12-20 DIAGNOSIS — I10 Essential (primary) hypertension: Secondary | ICD-10-CM

## 2019-03-12 DIAGNOSIS — L570 Actinic keratosis: Secondary | ICD-10-CM | POA: Diagnosis not present

## 2019-03-12 DIAGNOSIS — L812 Freckles: Secondary | ICD-10-CM | POA: Diagnosis not present

## 2019-03-12 DIAGNOSIS — Z85828 Personal history of other malignant neoplasm of skin: Secondary | ICD-10-CM | POA: Diagnosis not present

## 2019-03-12 DIAGNOSIS — D2271 Melanocytic nevi of right lower limb, including hip: Secondary | ICD-10-CM | POA: Diagnosis not present

## 2019-03-12 DIAGNOSIS — D2261 Melanocytic nevi of right upper limb, including shoulder: Secondary | ICD-10-CM | POA: Diagnosis not present

## 2019-03-12 DIAGNOSIS — C4441 Basal cell carcinoma of skin of scalp and neck: Secondary | ICD-10-CM | POA: Diagnosis not present

## 2019-04-01 DIAGNOSIS — C4441 Basal cell carcinoma of skin of scalp and neck: Secondary | ICD-10-CM | POA: Diagnosis not present

## 2019-05-29 ENCOUNTER — Other Ambulatory Visit: Payer: Self-pay | Admitting: Neurology

## 2019-08-13 ENCOUNTER — Encounter: Payer: Self-pay | Admitting: Family Medicine

## 2019-08-13 DIAGNOSIS — Z79899 Other long term (current) drug therapy: Secondary | ICD-10-CM | POA: Diagnosis not present

## 2019-08-13 DIAGNOSIS — H04123 Dry eye syndrome of bilateral lacrimal glands: Secondary | ICD-10-CM | POA: Diagnosis not present

## 2019-08-13 DIAGNOSIS — H40013 Open angle with borderline findings, low risk, bilateral: Secondary | ICD-10-CM | POA: Diagnosis not present

## 2019-08-13 DIAGNOSIS — G35 Multiple sclerosis: Secondary | ICD-10-CM | POA: Diagnosis not present

## 2019-08-13 DIAGNOSIS — H524 Presbyopia: Secondary | ICD-10-CM | POA: Diagnosis not present

## 2019-08-25 ENCOUNTER — Telehealth: Payer: Self-pay | Admitting: Adult Health

## 2019-08-25 NOTE — Telephone Encounter (Signed)
Pt called stating that his insurance is doing a end of the year coverage alert and they are needing a P.A. for his Gilenya. Pt went to fill it and just found this out and was informed that the process can take up to a week and the pt only has enough medication for a couple of more days. Pt would like to know if an emergency amount can be called in for him until this is resolved. Please advise.

## 2019-08-26 NOTE — Telephone Encounter (Signed)
Can you call pharmacy to see if we have any options? Also ask Terrence Dupont- as she may have had a similar situation

## 2019-08-27 NOTE — Telephone Encounter (Signed)
Spoke with the patient and gave him the number for [place]. He stated that he has enough medication to last and just wanted make sure that he has enough in case there is a issue with insurance. I advised that if he has any other question to give our office and we would assist him the best way possible.    Gilenya Go Program has been filled out and signed by NP. Fax has been sent with confirmation fax received.

## 2019-08-31 ENCOUNTER — Telehealth: Payer: Self-pay

## 2019-08-31 NOTE — Telephone Encounter (Signed)
Received Pa approval back from [employer] [facility]. Pa effective from 08/01/2019- 08/30/2020.  Case ID # [id number].

## 2019-08-31 NOTE — Telephone Encounter (Signed)
-----   Message from Kathrynn Ducking, MD sent at 08/31/2019  7:26 AM EST ----- I do not see a revisit scheduled for this patient, has been followed by [facility] for a number of years.  Will need a revisit at some point.

## 2019-08-31 NOTE — Telephone Encounter (Signed)
PA for Gilenya has been submitted to [facility] via fax. Fax sent to # [phone], confirmation received.

## 2019-09-02 NOTE — Telephone Encounter (Signed)
Called patient and got him scheduled for 1/5 with MM!

## 2019-09-29 ENCOUNTER — Ambulatory Visit: Payer: Managed Care, Other (non HMO) | Admitting: Adult Health

## 2019-10-14 ENCOUNTER — Other Ambulatory Visit: Payer: Self-pay | Admitting: Neurology

## 2019-11-24 ENCOUNTER — Other Ambulatory Visit: Payer: Self-pay

## 2019-12-08 ENCOUNTER — Other Ambulatory Visit: Payer: Self-pay

## 2019-12-08 ENCOUNTER — Ambulatory Visit: Payer: BC Managed Care – PPO | Admitting: Adult Health

## 2019-12-08 ENCOUNTER — Encounter: Payer: Self-pay | Admitting: Adult Health

## 2019-12-08 VITALS — BP 118/78 | HR 68 | Temp 98.1°F | Ht 65.0 in | Wt 136.0 lb

## 2019-12-08 DIAGNOSIS — R202 Paresthesia of skin: Secondary | ICD-10-CM

## 2019-12-08 DIAGNOSIS — R200 Anesthesia of skin: Secondary | ICD-10-CM | POA: Diagnosis not present

## 2019-12-08 DIAGNOSIS — G35 Multiple sclerosis: Secondary | ICD-10-CM

## 2019-12-08 NOTE — Progress Notes (Signed)
PATIENT: [name] [name] DOB: [birth year]  REASON FOR VISIT: follow up HISTORY FROM: patient  HISTORY OF PRESENT ILLNESS: Today 12/08/19:  [name] is a 54 year old male with a history of multiple sclerosis.  He returns today for follow-up.  He continues on Gilenya.  He reports that he does have ongoing numbness in the hands.  He feels that over the last year may have gotten worse.  The last 2 digits he feels is worse regarding the numbness.  Especially when he wakes up in the morning.  He denies any changes with his vision.  No changes with the bowels or bladder.  No new weakness.  No significant changes in his gait or balance.  He returns today for an evaluation.  HISTORY 09/11/18 Mr. Volesky is a 54 year old male with a history of multiple sclerosis.  He returns today for follow-up.  He remains on Gilenya and is tolerating it well.  He was scheduled to have MRI of the brain and cervical spine at the last visit he reports that he forgot to call [facility] imaging.  He has not completed the scans yet.  He denies any new numbness or weakness.  No change in his gait or balance.  No change in his vision.  No change in the bowels or bladder.  He returns today for evaluation.  REVIEW OF SYSTEMS: Out of a complete 14 system review of symptoms, the patient complains only of the following symptoms, and all other reviewed systems are negative.  ALLERGIES: Allergies  Allergen Reactions  . Mold Extract [Trichophyton]     congestion  . Penicillins Other (See Comments)    Reaction in childhood. Does not remember.    HOME MEDICATIONS: Outpatient Medications Prior to Visit  Medication Sig Dispense Refill  . amLODipine-valsartan (EXFORGE) 5-320 MG tablet TAKE 1 TABLET DAILY 90 tablet 3  . Cholecalciferol (VITAMIN D PO) Take 5,000 Units by mouth daily.     . fluticasone (FLONASE) 50 MCG/ACT nasal spray Place 2 sprays into both nostrils daily. 48 g 6  . gabapentin (NEURONTIN) 300 MG capsule Take  1 capsule (300 mg total) by mouth 3 (three) times daily. 270 capsule 0  . GILENYA 0.5 MG CAPS TAKE 1 CAPSULE DAILY 90 capsule 4  . levocetirizine (XYZAL) 5 MG tablet TAKE 1 TABLET EVERY EVENING 90 tablet 4  . Multiple Vitamin (MULTIVITAMIN WITH MINERALS) TABS Take 1 tablet by mouth daily.    . vitamin B-12 (CYANOCOBALAMIN) 1000 MCG tablet Take 1,000 mcg by mouth daily.     Facility-Administered Medications Prior to Visit  Medication Dose Route Frequency Provider Last Rate Last Admin  . gadopentetate dimeglumine (MAGNEVIST) injection 14 mL  14 mL Intravenous Once PRN Kathrynn Ducking, MD        PAST MEDICAL HISTORY: Past Medical History:  Diagnosis Date  . Bilateral inguinal hernia   . Hyperlipidemia   . Hypertension   . Multiple sclerosis ([place])   . Nephrolithiasis   . Neuromuscular disorder ([place])    MS    PAST SURGICAL HISTORY: Past Surgical History:  Procedure Laterality Date  . HERNIA REPAIR  08/27/12   bilateral repair with mesh  . NASAL SEPTUM SURGERY  05/30/2018  . SQUAMOUS CELL CARCINOMA EXCISION     From the forehead  . VASECTOMY      FAMILY HISTORY: Family History  Problem Relation Age of Onset  . Hypertension Father   . Colon polyps Father   . Cancer Maternal Grandfather  colon  . Colon cancer Neg Hx   . Esophageal cancer Neg Hx   . Rectal cancer Neg Hx   . Stomach cancer Neg Hx     SOCIAL HISTORY: Social History   Socioeconomic History  . Marital status: Married    Spouse name: Not on file  . Number of children: 3  . Years of education: College  . Highest education level: Not on file  Occupational History    Employer: [employer]  Tobacco Use  . Smoking status: Never Smoker  . Smokeless tobacco: Never Used  Substance and Sexual Activity  . Alcohol use: Yes    Alcohol/week: 0.0 standard drinks    Comment: OCC  . Drug use: No  . Sexual activity: Not on file  Other Topics Concern  . Not on file  Social History Narrative   Patient lives at  home with his wife Lattie Haw) and two daughters.   Patient works full time.   Education college.   Right handed.   Caffeine three sodas daily.   Social Determinants of Health   Financial Resource Strain:   . Difficulty of Paying Living Expenses:   Food Insecurity:   . Worried About Charity fundraiser in the Last Year:   . Arboriculturist in the Last Year:   Transportation Needs:   . Film/video editor (Medical):   Marland Kitchen Lack of Transportation (Non-Medical):   Physical Activity:   . Days of Exercise per Week:   . Minutes of Exercise per Session:   Stress:   . Feeling of Stress :   Social Connections:   . Frequency of Communication with Friends and Family:   . Frequency of Social Gatherings with Friends and Family:   . Attends Religious Services:   . Active Member of Clubs or Organizations:   . Attends Archivist Meetings:   Marland Kitchen Marital Status:   Intimate Partner Violence:   . Fear of Current or Ex-Partner:   . Emotionally Abused:   Marland Kitchen Physically Abused:   . Sexually Abused:       PHYSICAL EXAM  Vitals:   12/08/19 0803  BP: 118/78  Pulse: 68  Temp: 98.1 F (36.7 C)  Weight: 136 lb (61.7 kg)  Height: 5\' 5"  (1.651 m)   Body mass index is 22.63 kg/m.  Generalized: Well developed, in no acute distress   Neurological examination  Mentation: Alert oriented to time, place, history taking. Follows all commands speech and language fluent Cranial nerve II-XII: Pupils were equal round reactive to light. Extraocular movements were full, visual field were full on confrontational test.  Head turning and shoulder shrug  were normal and symmetric. Motor: The motor testing reveals 5 over 5 strength of all 4 extremities. Good symmetric motor tone is noted throughout.  Sensory: Sensory testing is intact to soft touch on all 4 extremities. No evidence of extinction is noted.  Coordination: Cerebellar testing reveals good finger-nose-finger and heel-to-shin bilaterally.  Gait  and station: Gait is normal.   Reflexes: Deep tendon reflexes are symmetric and normal bilaterally.   DIAGNOSTIC DATA (LABS, IMAGING, TESTING) - I reviewed patient records, labs, notes, testing and imaging myself where available.  Lab Results  Component Value Date   WBC 4.6 09/11/2018   HGB 13.1 09/11/2018   HCT 40.6 09/11/2018   MCV 90 09/11/2018   PLT 280 09/11/2018      Component Value Date/Time   NA 142 09/11/2018 0924   K 4.3 09/11/2018 [id number]  CL 102 09/11/2018 0924   CO2 27 09/11/2018 0924   GLUCOSE 107 (H) 09/11/2018 0924   GLUCOSE 97 02/04/2018 1429   BUN 17 09/11/2018 0924   CREATININE 1.09 09/11/2018 0924   CALCIUM 9.6 09/11/2018 0924   PROT 6.7 09/11/2018 0924   ALBUMIN 4.4 09/11/2018 0924   AST 12 09/11/2018 0924   ALT 19 09/11/2018 0924   ALKPHOS 99 09/11/2018 0924   BILITOT 0.3 09/11/2018 0924   GFRNONAA 78 09/11/2018 0924   GFRAA 90 09/11/2018 0924   Lab Results  Component Value Date   CHOL 178 02/04/2018   HDL 39.80 02/04/2018   LDLCALC 98 02/04/2018   TRIG 197.0 (H) 02/04/2018   CHOLHDL 4 02/04/2018   Lab Results  Component Value Date   HGBA1C 5.9 06/27/2016   No results found for: VITAMINB12 No results found for: TSH    ASSESSMENT AND PLAN 54 y.o. year old male  has a past medical history of Bilateral inguinal hernia, Hyperlipidemia, Hypertension, Multiple sclerosis ([place]), Nephrolithiasis, and Neuromuscular disorder ([place]). here with :  1.  Multiple sclerosis  -Continue Gilenya -Blood work today -Imaging done in January 2020 was stable  2.  Numbness in the upper extremities  -Could be due to MS? -Discussed nerve conduction studies due to the numbness primarily being in the last 2 digits on each hand.  Patient deferred for now  Advised if symptoms worsen or he develops new symptoms they should let us know.   I spent 25 minutes of face-to-face and non-face-to-face time with patient.  This included previsit chart review, lab review,  study review, order entry, electronic health record documentation, patient education.  Ward Givens, MSN, NP-C 12/08/2019, 8:11 AM [facility] [address] [place], [place] [address] [phone]

## 2019-12-08 NOTE — Progress Notes (Signed)
I have read the note, and I agree with the clinical assessment and plan.  Julias Mould K Javius Sylla   

## 2019-12-08 NOTE — Patient Instructions (Signed)
Your Plan:  Continue Gilenya  Blood work today  If your symptoms worsen or you develop new symptoms please let us know.   Thank you for coming to see us at [facility]. I hope we have been able to provide you high quality care today.  You may receive a patient satisfaction survey over the next few weeks. We would appreciate your feedback and comments so that we may continue to improve ourselves and the health of our patients.  

## 2019-12-09 ENCOUNTER — Telehealth: Payer: Self-pay

## 2019-12-09 LAB — CBC WITH DIFFERENTIAL/PLATELET
Basophils Absolute: 0 10*3/uL (ref 0.0–0.2)
Basos: 1 %
EOS (ABSOLUTE): 0.5 10*3/uL — ABNORMAL HIGH (ref 0.0–0.4)
Eos: 11 %
Hematocrit: 39.3 % (ref 37.5–51.0)
Hemoglobin: 13.2 g/dL (ref 13.0–17.7)
Immature Grans (Abs): 0 10*3/uL (ref 0.0–0.1)
Immature Granulocytes: 1 %
Lymphocytes Absolute: 0.9 10*3/uL (ref 0.7–3.1)
Lymphs: 22 %
MCH: 30.1 pg (ref 26.6–33.0)
MCHC: 33.6 g/dL (ref 31.5–35.7)
MCV: 90 fL (ref 79–97)
Monocytes Absolute: 0.6 10*3/uL (ref 0.1–0.9)
Monocytes: 15 %
Neutrophils Absolute: 2.1 10*3/uL (ref 1.4–7.0)
Neutrophils: 50 %
Platelets: 250 10*3/uL (ref 150–450)
RBC: 4.38 x10E6/uL (ref 4.14–5.80)
RDW: 12.8 % (ref 11.6–15.4)
WBC: 4.1 10*3/uL (ref 3.4–10.8)

## 2019-12-09 LAB — COMPREHENSIVE METABOLIC PANEL
ALT: 16 IU/L (ref 0–44)
AST: 19 IU/L (ref 0–40)
Albumin/Globulin Ratio: 2 (ref 1.2–2.2)
Albumin: 4.3 g/dL (ref 3.8–4.9)
Alkaline Phosphatase: 96 IU/L (ref 39–117)
BUN/Creatinine Ratio: 13 (ref 9–20)
BUN: 17 mg/dL (ref 6–24)
Bilirubin Total: 0.4 mg/dL (ref 0.0–1.2)
CO2: 23 mmol/L (ref 20–29)
Calcium: 9.5 mg/dL (ref 8.7–10.2)
Chloride: 107 mmol/L — ABNORMAL HIGH (ref 96–106)
Creatinine, Ser: 1.28 mg/dL — ABNORMAL HIGH (ref 0.76–1.27)
GFR calc Af Amer: 74 mL/min/{1.73_m2} (ref >59)
GFR calc non Af Amer: 64 mL/min/{1.73_m2} (ref >59)
Globulin, Total: 2.1 g/dL (ref 1.5–4.5)
Glucose: 98 mg/dL (ref 65–99)
Potassium: 4.6 mmol/L (ref 3.5–5.2)
Sodium: 144 mmol/L (ref 134–144)
Total Protein: 6.4 g/dL (ref 6.0–8.5)

## 2019-12-09 NOTE — Telephone Encounter (Signed)
-----   Message from Ward Givens, NP sent at 12/09/2019  2:01 PM EDT ----- Labs results are unremarkable. Please call patient with results.

## 2019-12-09 NOTE — Telephone Encounter (Signed)
I called pt to discuss. No answer, left a message asking him to call me back. 

## 2019-12-10 ENCOUNTER — Telehealth: Payer: Self-pay | Admitting: Adult Health

## 2019-12-10 MED ORDER — GILENYA 0.5 MG PO CAPS: 1.0000 | ORAL_CAPSULE | Freq: Every day | ORAL | 3 refills | Status: DC

## 2019-12-10 NOTE — Addendum Note (Signed)
Addended by: Gildardo Griffes on: 12/10/2019 02:43 PM   Modules accepted: Orders

## 2019-12-10 NOTE — Telephone Encounter (Signed)
Refill of Gilenya x 1 year sent to [facility]. Pt's last visit was this month. Continue medication per MM NP. Next visit pending March 2022.

## 2019-12-10 NOTE — Telephone Encounter (Signed)
Attempted to call pt, LVM to call back   

## 2019-12-10 NOTE — Telephone Encounter (Signed)
[facility] states the Rx they have on file for GILENYA 0.5 MG CAPS has expired, they are asking for a new one for pt

## 2019-12-10 NOTE — Telephone Encounter (Signed)
Pt has called back and the message from [facility] was relayed to him. No call back requested.

## 2019-12-11 DIAGNOSIS — J3089 Other allergic rhinitis: Secondary | ICD-10-CM | POA: Diagnosis not present

## 2019-12-11 DIAGNOSIS — R05 Cough: Secondary | ICD-10-CM | POA: Diagnosis not present

## 2019-12-11 DIAGNOSIS — J301 Allergic rhinitis due to pollen: Secondary | ICD-10-CM | POA: Diagnosis not present

## 2019-12-11 DIAGNOSIS — J300 Vasomotor rhinitis: Secondary | ICD-10-CM | POA: Diagnosis not present

## 2019-12-15 ENCOUNTER — Other Ambulatory Visit: Payer: Self-pay | Admitting: Family Medicine

## 2019-12-15 DIAGNOSIS — I10 Essential (primary) hypertension: Secondary | ICD-10-CM

## 2019-12-21 ENCOUNTER — Other Ambulatory Visit: Payer: Self-pay | Admitting: Family Medicine

## 2019-12-21 ENCOUNTER — Other Ambulatory Visit: Payer: Self-pay | Admitting: Neurology

## 2019-12-21 DIAGNOSIS — R432 Parageusia: Secondary | ICD-10-CM

## 2020-02-10 DIAGNOSIS — Z23 Encounter for immunization: Secondary | ICD-10-CM | POA: Diagnosis not present

## 2020-03-09 DIAGNOSIS — Z23 Encounter for immunization: Secondary | ICD-10-CM | POA: Diagnosis not present

## 2020-03-14 DIAGNOSIS — Z85828 Personal history of other malignant neoplasm of skin: Secondary | ICD-10-CM | POA: Diagnosis not present

## 2020-03-14 DIAGNOSIS — L578 Other skin changes due to chronic exposure to nonionizing radiation: Secondary | ICD-10-CM | POA: Diagnosis not present

## 2020-03-14 DIAGNOSIS — D2261 Melanocytic nevi of right upper limb, including shoulder: Secondary | ICD-10-CM | POA: Diagnosis not present

## 2020-03-14 DIAGNOSIS — L821 Other seborrheic keratosis: Secondary | ICD-10-CM | POA: Diagnosis not present

## 2020-03-14 DIAGNOSIS — L570 Actinic keratosis: Secondary | ICD-10-CM | POA: Diagnosis not present

## 2020-04-26 ENCOUNTER — Encounter: Payer: Self-pay | Admitting: Family Medicine

## 2020-04-26 ENCOUNTER — Other Ambulatory Visit: Payer: Self-pay

## 2020-04-26 ENCOUNTER — Ambulatory Visit (INDEPENDENT_AMBULATORY_CARE_PROVIDER_SITE_OTHER): Payer: BC Managed Care – PPO | Admitting: Family Medicine

## 2020-04-26 VITALS — BP 108/72 | HR 70 | Temp 98.1°F | Ht 65.0 in | Wt 135.2 lb

## 2020-04-26 DIAGNOSIS — Z1159 Encounter for screening for other viral diseases: Secondary | ICD-10-CM | POA: Diagnosis not present

## 2020-04-26 DIAGNOSIS — Z0001 Encounter for general adult medical examination with abnormal findings: Secondary | ICD-10-CM | POA: Diagnosis not present

## 2020-04-26 DIAGNOSIS — J3089 Other allergic rhinitis: Secondary | ICD-10-CM

## 2020-04-26 DIAGNOSIS — H6982 Other specified disorders of Eustachian tube, left ear: Secondary | ICD-10-CM

## 2020-04-26 DIAGNOSIS — Z0000 Encounter for general adult medical examination without abnormal findings: Secondary | ICD-10-CM

## 2020-04-26 LAB — COMPREHENSIVE METABOLIC PANEL
ALT: 21 U/L (ref 0–53)
AST: 19 U/L (ref 0–37)
Albumin: 4.2 g/dL (ref 3.5–5.2)
Alkaline Phosphatase: 89 U/L (ref 39–117)
BUN: 18 mg/dL (ref 6–23)
CO2: 31 mEq/L (ref 19–32)
Calcium: 9.5 mg/dL (ref 8.4–10.5)
Chloride: 107 mEq/L (ref 96–112)
Creatinine, Ser: 1.19 mg/dL (ref 0.40–1.50)
GFR: 64.03 mL/min (ref >60.00)
Glucose, Bld: 98 mg/dL (ref 70–99)
Potassium: 4.6 mEq/L (ref 3.5–5.1)
Sodium: 141 mEq/L (ref 135–145)
Total Bilirubin: 0.3 mg/dL (ref 0.2–1.2)
Total Protein: 6.4 g/dL (ref 6.0–8.3)

## 2020-04-26 LAB — CBC
HCT: 39.7 % (ref 39.0–52.0)
Hemoglobin: 13.2 g/dL (ref 13.0–17.0)
MCHC: 33.3 g/dL (ref 30.0–36.0)
MCV: 90.7 fl (ref 78.0–100.0)
Platelets: 259 10*3/uL (ref 150.0–400.0)
RBC: 4.38 Mil/uL (ref 4.22–5.81)
RDW: 13.6 % (ref 11.5–15.5)
WBC: 4.4 10*3/uL (ref 4.0–10.5)

## 2020-04-26 LAB — LIPID PANEL
Cholesterol: 170 mg/dL (ref 0–200)
HDL: 39.2 mg/dL (ref >39.00)
LDL Cholesterol: 116 mg/dL — ABNORMAL HIGH (ref 0–99)
NonHDL: 130.79
Total CHOL/HDL Ratio: 4
Triglycerides: 73 mg/dL (ref 0.0–149.0)
VLDL: 14.6 mg/dL (ref 0.0–40.0)

## 2020-04-26 MED ORDER — METHYLPREDNISOLONE ACETATE 80 MG/ML IJ SUSP
80.0000 mg | Freq: Once | INTRAMUSCULAR | Status: AC
  Administered 2020-04-26: 80 mg via INTRAMUSCULAR

## 2020-04-26 MED ORDER — CETIRIZINE HCL 10 MG PO TABS: 10.0000 mg | ORAL_TABLET | Freq: Every day | ORAL | 11 refills | Status: AC

## 2020-04-26 MED ORDER — MONTELUKAST SODIUM 10 MG PO TABS: 10.0000 mg | ORAL_TABLET | Freq: Every day | ORAL | 3 refills | Status: DC

## 2020-04-26 NOTE — Progress Notes (Signed)
Chief Complaint  Patient presents with  . Annual Exam    Well Male [name] [name] is here for a complete physical.   His last physical was >1 year ago.  Current diet: in general, a "healthy" diet.  Current exercise: lifting Weight trend: stable  Fatigue out of ordinary? No. Seat belt? Yes.     Health maintenance Shingrix- No Colonoscopy- Yes Tetanus- Yes HIV- Yes Hep C- No   Past Medical History:  Diagnosis Date  . Bilateral inguinal hernia   . Hyperlipidemia   . Hypertension   . Multiple sclerosis ([place])   . Nephrolithiasis   . Neuromuscular disorder [facility])    MS      Past Surgical History:  Procedure Laterality Date  . HERNIA REPAIR  08/27/12   bilateral repair with mesh  . NASAL SEPTUM SURGERY  05/30/2018  . SQUAMOUS CELL CARCINOMA EXCISION     From the forehead  . VASECTOMY      Medications  Current Outpatient Medications on File Prior to Visit  Medication Sig Dispense Refill  . amLODipine-valsartan (EXFORGE) 5-320 MG tablet TAKE 1 TABLET DAILY 90 tablet 3  . Cholecalciferol (VITAMIN D PO) Take 5,000 Units by mouth daily.     . Fingolimod HCl (GILENYA) 0.5 MG CAPS Take 1 capsule (0.5 mg total) by mouth daily. 90 capsule 3  . fluticasone (FLONASE) 50 MCG/ACT nasal spray USE 2 SPRAYS IN EACH NOSTRIL DAILY 48 g 3  . gabapentin (NEURONTIN) 300 MG capsule TAKE 1 CAPSULE THREE TIMES A DAY (KEEP APPOINTMENT ON 12/08/19 FOR CONTINUED REFILLS ) 270 capsule 3  . levocetirizine (XYZAL) 5 MG tablet TAKE 1 TABLET EVERY EVENING 90 tablet 4  . Multiple Vitamin (MULTIVITAMIN WITH MINERALS) TABS Take 1 tablet by mouth daily.    . vitamin B-12 (CYANOCOBALAMIN) 1000 MCG tablet Take 1,000 mcg by mouth daily.      Allergies Allergies  Allergen Reactions  . Mold Extract [Trichophyton]     congestion  . Penicillins Other (See Comments)    Reaction in childhood. Does not remember.    Family History Family History  Problem Relation Age of Onset  . Hypertension Father   .  Colon polyps Father   . Cancer Maternal Grandfather        colon  . Colon cancer Neg Hx   . Esophageal cancer Neg Hx   . Rectal cancer Neg Hx   . Stomach cancer Neg Hx    Review of Systems: Constitutional:  no fevers Eye:  no recent significant change in vision Ear/Nose/Mouth/Throat:  Ears:  no hearing loss, + ear fullness Nose/Mouth/Throat:  + nasal congestion, no sore throat Cardiovascular:  no chest pain Respiratory:  no shortness of breath Gastrointestinal:  no change in bowel habits GU:  Male: negative for dysuria, frequency Musculoskeletal/Extremities:  No new joint pain Integumentary (Skin/Breast):  no abnormal skin lesions reported Neurologic:  no headaches Endocrine: No unexpected weight changes Hematologic/Lymphatic:  no abnormal bleeding  Exam BP 108/72 (BP Location: Left Arm, Patient Position: Sitting, Cuff Size: Normal)   Pulse 70   Temp 98.1 F (36.7 C) (Oral)   Ht 5\' 5"  (1.651 m)   Wt 135 lb 4 oz (61.3 kg)   SpO2 98%   BMI 22.51 kg/m  General:  well developed, well nourished, in no apparent distress Skin:  no significant moles, warts, or growths Head:  no masses, lesions, or tenderness Eyes:  pupils equal and round, sclera anicteric without injection Ears:  canals without lesions,  TMs shiny without retraction, no obvious effusion, no erythema Nose:  nares patent, septum midline, mucosa normal Throat/Pharynx:  lips and gingiva without lesion; tongue and uvula midline; non-inflamed pharynx; no exudates or postnasal drainage Neck: neck supple without adenopathy, thyromegaly, or masses Cardiac: RRR, no bruits, no LE edema Lungs:  clear to auscultation, breath sounds equal bilaterally, no respiratory distress Rectal: Deferred Musculoskeletal:  symmetrical muscle groups noted without atrophy or deformity Neuro:  gait normal; deep tendon reflexes normal and symmetric Psych: well oriented with normal range of affect and appropriate judgment/insight  Assessment  and Plan  Well adult exam - Plan: CBC, Comprehensive metabolic panel, Lipid panel  Encounter for hepatitis C screening test for low risk patient - Plan: Hepatitis C antibody  Dysfunction of left eustachian tube - Plan: methylPREDNISolone acetate (DEPO-MEDROL) injection 80 mg  Allergic rhinitis due to other allergic trigger, unspecified seasonality - Plan: methylPREDNISolone acetate (DEPO-MEDROL) injection 80 mg   Well 54 y.o. male. Counseled on diet and exercise. Counseled on risks and benefits of prostate cancer screening with PSA. The patient agrees to forego testing. Depo shot today as this has helped his allergy s/s's in past. Cont Zyrtec, Singulair, Flonase and Asteline spray. He follows with allergy team.  Immunizations, labs, and further orders as above. Follow up in 6 mo for med ck. The patient voiced understanding and agreement to the plan.  Dewar, DO 04/26/20 7:52 AM

## 2020-04-26 NOTE — Patient Instructions (Addendum)
Give [place] 2-3 business days to get the results of your labs back.   Keep the diet clean and stay active.  The new Shingrix vaccine (for shingles) is a 2 shot series. It can make people feel low energy, achy and almost like they have the flu for 48 hours after injection. Please plan accordingly when deciding on when to get this shot. Call our office for a nurse visit appointment to get this. The second shot of the series is less severe regarding the side effects, but it still lasts 48 hours. Wait 2 weeks after any steroid shot before getting this vaccine.  Let us know if you need anything.

## 2020-04-27 LAB — HEPATITIS C ANTIBODY
Hepatitis C Ab: NONREACTIVE
SIGNAL TO CUT-OFF: 0.01 (ref <1.00)

## 2020-07-19 DIAGNOSIS — Z23 Encounter for immunization: Secondary | ICD-10-CM | POA: Diagnosis not present

## 2020-08-22 DIAGNOSIS — J300 Vasomotor rhinitis: Secondary | ICD-10-CM | POA: Diagnosis not present

## 2020-08-22 DIAGNOSIS — R059 Cough, unspecified: Secondary | ICD-10-CM | POA: Diagnosis not present

## 2020-08-22 DIAGNOSIS — J301 Allergic rhinitis due to pollen: Secondary | ICD-10-CM | POA: Diagnosis not present

## 2020-08-22 DIAGNOSIS — J3089 Other allergic rhinitis: Secondary | ICD-10-CM | POA: Diagnosis not present

## 2020-09-12 ENCOUNTER — Telehealth: Payer: Self-pay | Admitting: Adult Health

## 2020-09-12 NOTE — Telephone Encounter (Signed)
[facility] [employer] technical [employer]) need PA for Fingolimod HCl (GILENYA) 0.5 MG CAPS. Contact: [phone], Case ID no: [id number].

## 2020-09-14 NOTE — Telephone Encounter (Signed)
Determination pending. 

## 2020-09-14 NOTE — Telephone Encounter (Signed)
CMM initiated [place].  Gilenya 0.5mg  po daily DX G35, tried copaxone, then [place] 05-07-2017.

## 2020-09-19 NOTE — Telephone Encounter (Signed)
I spoke to [facility] approval 08-15-20 thru 21-25-2022.   gilenya 0.5mg  caps.

## 2020-09-28 ENCOUNTER — Encounter: Payer: Self-pay | Admitting: Adult Health

## 2020-09-28 DIAGNOSIS — I10 Essential (primary) hypertension: Secondary | ICD-10-CM

## 2020-09-28 MED ORDER — AMLODIPINE BESYLATE-VALSARTAN 5-320 MG PO TABS: 1.0000 | ORAL_TABLET | Freq: Every day | ORAL | 3 refills | Status: DC

## 2020-09-28 MED ORDER — GILENYA 0.5 MG PO CAPS: 1.0000 | ORAL_CAPSULE | Freq: Every day | ORAL | 1 refills | Status: DC

## 2020-09-28 MED ORDER — GABAPENTIN 300 MG PO CAPS: 300.0000 mg | ORAL_CAPSULE | Freq: Three times a day (TID) | ORAL | 1 refills | Status: DC

## 2020-10-21 ENCOUNTER — Telehealth: Payer: Self-pay | Admitting: Adult Health

## 2020-10-21 NOTE — Telephone Encounter (Signed)
[facility]) called, Notifying the physician; have attempted to reach out to Pt to confirm deliver of Fingolimod HCl (GILENYA) 0.5 MG CAPS. Because Pt has not responded, delivery of medication has been put on hold.

## 2020-11-07 NOTE — Telephone Encounter (Signed)
Mailed note to pt.  

## 2020-11-21 ENCOUNTER — Other Ambulatory Visit: Payer: Self-pay | Admitting: Family Medicine

## 2020-11-21 DIAGNOSIS — I10 Essential (primary) hypertension: Secondary | ICD-10-CM

## 2020-12-07 ENCOUNTER — Other Ambulatory Visit: Payer: Self-pay

## 2020-12-07 ENCOUNTER — Ambulatory Visit: Payer: BC Managed Care – PPO | Admitting: Adult Health

## 2020-12-07 VITALS — BP 115/70 | HR 64 | Ht 65.0 in | Wt 138.0 lb

## 2020-12-07 DIAGNOSIS — G35 Multiple sclerosis: Secondary | ICD-10-CM

## 2020-12-07 DIAGNOSIS — R202 Paresthesia of skin: Secondary | ICD-10-CM | POA: Diagnosis not present

## 2020-12-07 DIAGNOSIS — R200 Anesthesia of skin: Secondary | ICD-10-CM | POA: Diagnosis not present

## 2020-12-07 MED ORDER — GABAPENTIN 300 MG PO CAPS: 300.0000 mg | ORAL_CAPSULE | Freq: Two times a day (BID) | ORAL | 3 refills | Status: DC

## 2020-12-07 NOTE — Progress Notes (Signed)
PATIENT: [name] [name] DOB: 1966/08/04  REASON FOR VISIT: follow up HISTORY FROM: patient  HISTORY OF PRESENT ILLNESS: Today 12/07/20:  [name] [name] is a 55 year old male with a history of multiple sclerosis.  He returns today for follow-up.  He remains on Gilenya.  He denies any new numbness or weakness.  Denies any changes with his gait or balance.  No changes with his vision.  No changes with the bowels or bladder.  Reports that he continues to take gabapentin-reports that he usually takes it twice a day instead of 3 times a day.  He returns today for an evaluation.  12/08/19: [name] [name] is a 55 year old male with a history of multiple sclerosis.  He returns today for follow-up.  He continues on Gilenya.  He reports that he does have ongoing numbness in the hands.  He feels that over the last year may have gotten worse.  The last 2 digits he feels is worse regarding the numbness.  Especially when he wakes up in the morning.  He denies any changes with his vision.  No changes with the bowels or bladder.  No new weakness.  No significant changes in his gait or balance.  He returns today for an evaluation.  HISTORY 09/11/18 [name] [name] is a 54 year old male with a history of multiple sclerosis.  He returns today for follow-up.  He remains on Gilenya and is tolerating it well.  He was scheduled to have MRI of the brain and cervical spine at the last visit he reports that he forgot to call [facility] imaging.  He has not completed the scans yet.  He denies any new numbness or weakness.  No change in his gait or balance.  No change in his vision.  No change in the bowels or bladder.  He returns today for evaluation.  REVIEW OF SYSTEMS: Out of a complete 14 system review of symptoms, the patient complains only of the following symptoms, and all other reviewed systems are negative.  See HPI  ALLERGIES: Allergies  Allergen Reactions  . Mold Extract [Trichophyton]     congestion  .  Penicillins Other (See Comments)    Reaction in childhood. Does not remember.    HOME MEDICATIONS: Outpatient Medications Prior to Visit  Medication Sig Dispense Refill  . amLODipine-valsartan (EXFORGE) 5-320 MG tablet TAKE 1 TABLET DAILY 90 tablet 3  . cetirizine (ZYRTEC) 10 MG tablet Take 1 tablet (10 mg total) by mouth daily. 30 tablet 11  . Cholecalciferol (VITAMIN D PO) Take 5,000 Units by mouth daily.     . Fingolimod HCl (GILENYA) 0.5 MG CAPS Take 1 capsule (0.5 mg total) by mouth daily. 90 capsule 1  . fluticasone (FLONASE) 50 MCG/ACT nasal spray USE 2 SPRAYS IN EACH NOSTRIL DAILY 48 g 3  . gabapentin (NEURONTIN) 300 MG capsule Take 1 capsule (300 mg total) by mouth 3 (three) times daily. 270 capsule 1  . montelukast (SINGULAIR) 10 MG tablet Take 1 tablet (10 mg total) by mouth at bedtime. 90 tablet 3  . Multiple Vitamin (MULTIVITAMIN WITH MINERALS) TABS Take 1 tablet by mouth daily.    . vitamin B-12 (CYANOCOBALAMIN) 1000 MCG tablet Take 1,000 mcg by mouth daily.     Facility-Administered Medications Prior to Visit  Medication Dose Route Frequency Provider Last Rate Last Admin  . gadopentetate dimeglumine (MAGNEVIST) injection 14 mL  14 mL Intravenous Once PRN Kathrynn Ducking, MD        PAST MEDICAL HISTORY: Past Medical  History:  Diagnosis Date  . Bilateral inguinal hernia   . Hyperlipidemia   . Hypertension   . Multiple sclerosis ([place])   . Nephrolithiasis   . Neuromuscular disorder ([place])    MS    PAST SURGICAL HISTORY: Past Surgical History:  Procedure Laterality Date  . HERNIA REPAIR  08/27/12   bilateral repair with mesh  . NASAL SEPTUM SURGERY  05/30/2018  . SQUAMOUS CELL CARCINOMA EXCISION     From the forehead  . VASECTOMY      FAMILY HISTORY: Family History  Problem Relation Age of Onset  . Hypertension Father   . Colon polyps Father   . Cancer Maternal Grandfather        colon  . Colon cancer Neg Hx   . Esophageal cancer Neg Hx   . Rectal  cancer Neg Hx   . Stomach cancer Neg Hx     SOCIAL HISTORY: Social History   Socioeconomic History  . Marital status: Married    Spouse name: Not on file  . Number of children: 3  . Years of education: College  . Highest education level: Not on file  Occupational History    Employer: [employer]  Tobacco Use  . Smoking status: Never Smoker  . Smokeless tobacco: Never Used  Vaping Use  . Vaping Use: Never used  Substance and Sexual Activity  . Alcohol use: Yes    Alcohol/week: 0.0 standard drinks    Comment: OCC  . Drug use: No  . Sexual activity: Not on file  Other Topics Concern  . Not on file  Social History Narrative   Patient lives at home with his wife [name] [name]) and two daughters.   Patient works full time.   Education college.   Right handed.   Caffeine three sodas daily.   Social Determinants of Health   Financial Resource Strain: Not on file  Food Insecurity: Not on file  Transportation Needs: Not on file  Physical Activity: Not on file  Stress: Not on file  Social Connections: Not on file  Intimate Partner Violence: Not on file      PHYSICAL EXAM  Vitals:   12/07/20 0824  BP: 115/70  Pulse: 64  Weight: 138 lb (62.6 kg)  Height: 5\' 5"  (1.651 m)   Body mass index is 22.96 kg/m.  Generalized: Well developed, in no acute distress   Neurological examination  Mentation: Alert oriented to time, place, history taking. Follows all commands speech and language fluent Cranial nerve II-XII: Pupils were equal round reactive to light. Extraocular movements were full, visual field were full on confrontational test.  Head turning and shoulder shrug  were normal and symmetric. Motor: The motor testing reveals 5 over 5 strength of all 4 extremities. Good symmetric motor tone is noted throughout.  Sensory: Sensory testing is intact to soft touch on all 4 extremities. No evidence of extinction is noted.  Coordination: Cerebellar testing reveals good finger-nose-finger  and heel-to-shin bilaterally.  Gait and station: Gait is normal.   Reflexes: Deep tendon reflexes are symmetric and normal bilaterally.   DIAGNOSTIC DATA (LABS, IMAGING, TESTING) - I reviewed patient records, labs, notes, testing and imaging myself where available.  Lab Results  Component Value Date   WBC 4.4 04/26/2020   HGB 13.2 04/26/2020   HCT 39.7 04/26/2020   MCV 90.7 04/26/2020   PLT 259.0 04/26/2020      Component Value Date/Time   NA 141 04/26/2020 0753   NA 144 12/08/2019 0841  K 4.6 04/26/2020 0753   CL 107 04/26/2020 0753   CO2 31 04/26/2020 0753   GLUCOSE 98 04/26/2020 0753   BUN 18 04/26/2020 0753   BUN 17 12/08/2019 0841   CREATININE 1.19 04/26/2020 0753   CALCIUM 9.5 04/26/2020 0753   PROT 6.4 04/26/2020 0753   PROT 6.4 12/08/2019 0841   ALBUMIN 4.2 04/26/2020 0753   ALBUMIN 4.3 12/08/2019 0841   AST 19 04/26/2020 0753   ALT 21 04/26/2020 0753   ALKPHOS 89 04/26/2020 0753   BILITOT 0.3 04/26/2020 0753   BILITOT 0.4 12/08/2019 0841   GFRNONAA 64 12/08/2019 0841   GFRAA 74 12/08/2019 0841   Lab Results  Component Value Date   CHOL 170 04/26/2020   HDL 39.20 04/26/2020   LDLCALC 116 (H) 04/26/2020   TRIG 73.0 04/26/2020   CHOLHDL 4 04/26/2020   Lab Results  Component Value Date   HGBA1C 5.9 06/27/2016   No results found for: VITAMINB12 No results found for: TSH    ASSESSMENT AND PLAN 55 y.o. year old male  has a past medical history of Bilateral inguinal hernia, Hyperlipidemia, Hypertension, Multiple sclerosis ([place]), Nephrolithiasis, and Neuromuscular disorder ([place]). here with :  1.  Multiple sclerosis  -Continue Gilenya -Blood work today -We will repeat imaging at the next visit -Continue gabapentin 300 mg 2 times a day   Advised if symptoms worsen or he develops new symptoms they should let us know.  Follow-up in 6 months or sooner if needed   I spent 25 minutes of face-to-face and non-face-to-face time with patient.  This included  previsit chart review, lab review, study review, order entry, electronic health record documentation, patient education.  Ward Givens, MSN, NP-C 12/07/2020, 8:38 AM [facility] [address], [place], [place] [address] [phone]

## 2020-12-07 NOTE — Patient Instructions (Signed)
Your Plan:  Continue Gilenya  Blood work today  If your symptoms worsen or you develop new symptoms please let us know.   Thank you for coming to see [place] at [facility]. I hope we have been able to provide you high quality care today.  You may receive a patient satisfaction survey over the next few weeks. We would appreciate your feedback and comments so that we may continue to improve ourselves and the health of our patients.

## 2020-12-08 LAB — CBC WITH DIFFERENTIAL/PLATELET
Basophils Absolute: 0.1 10*3/uL (ref 0.0–0.2)
Basos: 2 %
EOS (ABSOLUTE): 0.9 10*3/uL — ABNORMAL HIGH (ref 0.0–0.4)
Eos: 18 %
Hematocrit: 40.3 % (ref 37.5–51.0)
Hemoglobin: 13.6 g/dL (ref 13.0–17.7)
Immature Grans (Abs): 0.1 10*3/uL (ref 0.0–0.1)
Immature Granulocytes: 1 %
Lymphocytes Absolute: 1.1 10*3/uL (ref 0.7–3.1)
Lymphs: 20 %
MCH: 29.3 pg (ref 26.6–33.0)
MCHC: 33.7 g/dL (ref 31.5–35.7)
MCV: 87 fL (ref 79–97)
Monocytes Absolute: 0.8 10*3/uL (ref 0.1–0.9)
Monocytes: 14 %
Neutrophils Absolute: 2.4 10*3/uL (ref 1.4–7.0)
Neutrophils: 45 %
Platelets: 239 10*3/uL (ref 150–450)
RBC: 4.64 x10E6/uL (ref 4.14–5.80)
RDW: 13 % (ref 11.6–15.4)
WBC: 5.3 10*3/uL (ref 3.4–10.8)

## 2020-12-08 LAB — COMPREHENSIVE METABOLIC PANEL
ALT: 20 IU/L (ref 0–44)
AST: 19 IU/L (ref 0–40)
Albumin/Globulin Ratio: 2.3 — ABNORMAL HIGH (ref 1.2–2.2)
Albumin: 4.3 g/dL (ref 3.8–4.9)
Alkaline Phosphatase: 99 IU/L (ref 44–121)
BUN/Creatinine Ratio: 10 (ref 9–20)
BUN: 12 mg/dL (ref 6–24)
Bilirubin Total: 0.3 mg/dL (ref 0.0–1.2)
CO2: 23 mmol/L (ref 20–29)
Calcium: 9 mg/dL (ref 8.7–10.2)
Chloride: 105 mmol/L (ref 96–106)
Creatinine, Ser: 1.18 mg/dL (ref 0.76–1.27)
Globulin, Total: 1.9 g/dL (ref 1.5–4.5)
Glucose: 95 mg/dL (ref 65–99)
Potassium: 4.5 mmol/L (ref 3.5–5.2)
Sodium: 144 mmol/L (ref 134–144)
Total Protein: 6.2 g/dL (ref 6.0–8.5)
eGFR: 74 mL/min/{1.73_m2} (ref >59)

## 2021-02-21 DIAGNOSIS — J3089 Other allergic rhinitis: Secondary | ICD-10-CM | POA: Diagnosis not present

## 2021-02-21 DIAGNOSIS — R059 Cough, unspecified: Secondary | ICD-10-CM | POA: Diagnosis not present

## 2021-02-21 DIAGNOSIS — J301 Allergic rhinitis due to pollen: Secondary | ICD-10-CM | POA: Diagnosis not present

## 2021-02-21 DIAGNOSIS — J300 Vasomotor rhinitis: Secondary | ICD-10-CM | POA: Diagnosis not present

## 2021-02-23 DIAGNOSIS — Z8709 Personal history of other diseases of the respiratory system: Secondary | ICD-10-CM | POA: Diagnosis not present

## 2021-02-23 DIAGNOSIS — J3489 Other specified disorders of nose and nasal sinuses: Secondary | ICD-10-CM | POA: Diagnosis not present

## 2021-02-23 DIAGNOSIS — J330 Polyp of nasal cavity: Secondary | ICD-10-CM | POA: Diagnosis not present

## 2021-02-24 DIAGNOSIS — R519 Headache, unspecified: Secondary | ICD-10-CM | POA: Diagnosis not present

## 2021-02-24 DIAGNOSIS — J339 Nasal polyp, unspecified: Secondary | ICD-10-CM | POA: Diagnosis not present

## 2021-03-07 DIAGNOSIS — J330 Polyp of nasal cavity: Secondary | ICD-10-CM | POA: Diagnosis not present

## 2021-03-07 DIAGNOSIS — J0191 Acute recurrent sinusitis, unspecified: Secondary | ICD-10-CM | POA: Diagnosis not present

## 2021-03-07 DIAGNOSIS — Z7952 Long term (current) use of systemic steroids: Secondary | ICD-10-CM | POA: Diagnosis not present

## 2021-03-07 DIAGNOSIS — J339 Nasal polyp, unspecified: Secondary | ICD-10-CM | POA: Diagnosis not present

## 2021-03-15 DIAGNOSIS — L988 Other specified disorders of the skin and subcutaneous tissue: Secondary | ICD-10-CM | POA: Diagnosis not present

## 2021-03-15 DIAGNOSIS — D485 Neoplasm of uncertain behavior of skin: Secondary | ICD-10-CM | POA: Diagnosis not present

## 2021-03-15 DIAGNOSIS — Z85828 Personal history of other malignant neoplasm of skin: Secondary | ICD-10-CM | POA: Diagnosis not present

## 2021-03-15 DIAGNOSIS — D2261 Melanocytic nevi of right upper limb, including shoulder: Secondary | ICD-10-CM | POA: Diagnosis not present

## 2021-03-15 DIAGNOSIS — D225 Melanocytic nevi of trunk: Secondary | ICD-10-CM | POA: Diagnosis not present

## 2021-03-15 DIAGNOSIS — L905 Scar conditions and fibrosis of skin: Secondary | ICD-10-CM | POA: Diagnosis not present

## 2021-03-22 DIAGNOSIS — R0981 Nasal congestion: Secondary | ICD-10-CM | POA: Diagnosis not present

## 2021-04-03 HISTORY — PX: NASAL SINUS SURGERY: SHX719

## 2021-04-20 DIAGNOSIS — C4442 Squamous cell carcinoma of skin of scalp and neck: Secondary | ICD-10-CM | POA: Diagnosis not present

## 2021-05-01 DIAGNOSIS — D225 Melanocytic nevi of trunk: Secondary | ICD-10-CM | POA: Diagnosis not present

## 2021-05-01 DIAGNOSIS — D485 Neoplasm of uncertain behavior of skin: Secondary | ICD-10-CM | POA: Diagnosis not present

## 2021-06-13 ENCOUNTER — Other Ambulatory Visit: Payer: Self-pay

## 2021-06-13 ENCOUNTER — Encounter: Payer: Self-pay | Admitting: Adult Health

## 2021-06-13 ENCOUNTER — Ambulatory Visit: Payer: BC Managed Care – PPO | Admitting: Adult Health

## 2021-06-13 VITALS — BP 116/76 | HR 65 | Ht 64.0 in | Wt 145.0 lb

## 2021-06-13 DIAGNOSIS — R200 Anesthesia of skin: Secondary | ICD-10-CM

## 2021-06-13 DIAGNOSIS — R202 Paresthesia of skin: Secondary | ICD-10-CM | POA: Diagnosis not present

## 2021-06-13 DIAGNOSIS — G35 Multiple sclerosis: Secondary | ICD-10-CM | POA: Diagnosis not present

## 2021-06-13 NOTE — Progress Notes (Signed)
I have read the note, and I agree with the clinical assessment and plan.  Azeneth Carbonell K Jeena Arnett   

## 2021-06-13 NOTE — Progress Notes (Signed)
PATIENT: [name] [name] DOB: 1965/12/05  REASON FOR VISIT: follow up HISTORY FROM: patient  HISTORY OF PRESENT ILLNESS: Today 06/13/21:  12/07/20: [name] [name] is a 55 year old male with a history of multiple sclerosis.  He returns today for follow-up.  Overall he feels that he has remained stable.  Continues on Gilenya.  Denies any changes with the bowels or bladder.  No new changes with his gait or balance.  No new numbness or weakness.  Continues on gabapentin 300 mg twice a day  12/07/20: [name] [name] is a 55 year old male with a history of multiple sclerosis.  He returns today for follow-up.  He remains on Gilenya.  He denies any new numbness or weakness.  Denies any changes with his gait or balance.  No changes with his vision.  No changes with the bowels or bladder.  Reports that he continues to take gabapentin-reports that he usually takes it twice a day instead of 3 times a day.  He returns today for an evaluation.  12/08/19: [name] [name] is a 55 year old male with a history of multiple sclerosis.  He returns today for follow-up.  He continues on Gilenya.  He reports that he does have ongoing numbness in the hands.  He feels that over the last year may have gotten worse.  The last 2 digits he feels is worse regarding the numbness.  Especially when he wakes up in the morning.  He denies any changes with his vision.  No changes with the bowels or bladder.  No new weakness.  No significant changes in his gait or balance.  He returns today for an evaluation.  HISTORY 09/11/18 [name] [name] is a 55 year old male with a history of multiple sclerosis.  He returns today for follow-up.  He remains on Gilenya and is tolerating it well.  He was scheduled to have MRI of the brain and cervical spine at the last visit he reports that he forgot to call [facility] imaging.  He has not completed the scans yet.  He denies any new numbness or weakness.  No change in his gait or balance.  No change in his vision.   No change in the bowels or bladder.  He returns today for evaluation.  REVIEW OF SYSTEMS: Out of a complete 14 system review of symptoms, the patient complains only of the following symptoms, and all other reviewed systems are negative.  See HPI  ALLERGIES: Allergies  Allergen Reactions   Mold Extract [Trichophyton]     congestion   Penicillins Other (See Comments)    Reaction in childhood. Does not remember.    HOME MEDICATIONS: Outpatient Medications Prior to Visit  Medication Sig Dispense Refill   amLODipine-valsartan (EXFORGE) 5-320 MG tablet TAKE 1 TABLET DAILY 90 tablet 3   cetirizine (ZYRTEC) 10 MG tablet Take 1 tablet (10 mg total) by mouth daily. 30 tablet 11   Cholecalciferol (VITAMIN D PO) Take 5,000 Units by mouth daily.      Fingolimod HCl (GILENYA) 0.5 MG CAPS Take 1 capsule (0.5 mg total) by mouth daily. 90 capsule 1   fluticasone (FLONASE) 50 MCG/ACT nasal spray USE 2 SPRAYS IN EACH NOSTRIL DAILY 48 g 3   gabapentin (NEURONTIN) 300 MG capsule Take 1 capsule (300 mg total) by mouth 2 (two) times daily. 180 capsule 3   montelukast (SINGULAIR) 10 MG tablet Take 1 tablet (10 mg total) by mouth at bedtime. 90 tablet 3   Multiple Vitamin (MULTIVITAMIN WITH MINERALS) TABS Take 1 tablet by  mouth daily.     vitamin B-12 (CYANOCOBALAMIN) 1000 MCG tablet Take 1,000 mcg by mouth daily.     Facility-Administered Medications Prior to Visit  Medication Dose Route Frequency Provider Last Rate Last Admin   gadopentetate dimeglumine (MAGNEVIST) injection 14 mL  14 mL Intravenous Once PRN Kathrynn Ducking, MD        PAST MEDICAL HISTORY: Past Medical History:  Diagnosis Date   Bilateral inguinal hernia    Hyperlipidemia    Hypertension    Multiple sclerosis ([place])    Nephrolithiasis    Neuromuscular disorder ([place])    MS    PAST SURGICAL HISTORY: Past Surgical History:  Procedure Laterality Date   HERNIA REPAIR  08/27/2012   bilateral repair with mesh   NASAL SEPTUM  SURGERY  05/30/2018   NASAL SINUS SURGERY  04/03/2021   SQUAMOUS CELL CARCINOMA EXCISION     From the forehead   VASECTOMY      FAMILY HISTORY: Family History  Problem Relation Age of Onset   Hypertension Father    Colon polyps Father    Cancer Maternal Grandfather        colon   Colon cancer Neg Hx    Esophageal cancer Neg Hx    Rectal cancer Neg Hx    Stomach cancer Neg Hx     SOCIAL HISTORY: Social History   Socioeconomic History   Marital status: Married    Spouse name: Not on file   Number of children: 3   Years of education: College   Highest education level: Not on file  Occupational History    Employer: [employer]  Tobacco Use   Smoking status: Never   Smokeless tobacco: Never  Vaping Use   Vaping Use: Never used  Substance and Sexual Activity   Alcohol use: Yes    Alcohol/week: 0.0 standard drinks    Comment: OCC   Drug use: No   Sexual activity: Not on file  Other Topics Concern   Not on file  Social History Narrative   Patient lives at home with his wife Lattie Haw) and two daughters.   Patient works full time.   Education college.   Right handed.   Caffeine three sodas daily.   Social Determinants of Health   Financial Resource Strain: Not on file  Food Insecurity: Not on file  Transportation Needs: Not on file  Physical Activity: Not on file  Stress: Not on file  Social Connections: Not on file  Intimate Partner Violence: Not on file      PHYSICAL EXAM  Vitals:   06/13/21 0810  BP: 116/76  Pulse: 65  Weight: 145 lb (65.8 kg)  Height: 5\' 4"  (1.626 m)   Body mass index is 24.89 kg/m.  Generalized: Well developed, in no acute distress   Neurological examination  Mentation: Alert oriented to time, place, history taking. Follows all commands speech and language fluent Cranial nerve II-XII: Pupils were equal round reactive to light. Extraocular movements were full, visual field were full on confrontational test.  Head turning and shoulder  shrug  were normal and symmetric. Motor: The motor testing reveals 5 over 5 strength of all 4 extremities. Good symmetric motor tone is noted throughout.  Sensory: Sensory testing is intact to soft touch on all 4 extremities. No evidence of extinction is noted.  Coordination: Cerebellar testing reveals good finger-nose-finger and heel-to-shin bilaterally.  Gait and station: Gait is normal.   Reflexes: Deep tendon reflexes are symmetric and normal bilaterally.  DIAGNOSTIC DATA (LABS, IMAGING, TESTING) - I reviewed patient records, labs, notes, testing and imaging myself where available.  Lab Results  Component Value Date   WBC 5.3 12/07/2020   HGB 13.6 12/07/2020   HCT 40.3 12/07/2020   MCV 87 12/07/2020   PLT 239 12/07/2020      Component Value Date/Time   NA 144 12/07/2020 0856   K 4.5 12/07/2020 0856   CL 105 12/07/2020 0856   CO2 23 12/07/2020 0856   GLUCOSE 95 12/07/2020 0856   GLUCOSE 98 04/26/2020 0753   BUN 12 12/07/2020 0856   CREATININE 1.18 12/07/2020 0856   CALCIUM 9.0 12/07/2020 0856   PROT 6.2 12/07/2020 0856   ALBUMIN 4.3 12/07/2020 0856   AST 19 12/07/2020 0856   ALT 20 12/07/2020 0856   ALKPHOS 99 12/07/2020 0856   BILITOT 0.3 12/07/2020 0856   GFRNONAA 64 12/08/2019 0841   GFRAA 74 12/08/2019 0841   Lab Results  Component Value Date   CHOL 170 04/26/2020   HDL 39.20 04/26/2020   LDLCALC 116 (H) 04/26/2020   TRIG 73.0 04/26/2020   CHOLHDL 4 04/26/2020   Lab Results  Component Value Date   HGBA1C 5.9 06/27/2016     ASSESSMENT AND PLAN 55 y.o. year old male  has a past medical history of Bilateral inguinal hernia, Hyperlipidemia, Hypertension, Multiple sclerosis ([place]), Nephrolithiasis, and Neuromuscular disorder ([place]). here with :  1.  Multiple sclerosis 2.  Numbness and tingling in the upper extremities   -Continue Gilenya -Blood work today -We will repeat imaging at the next visit -Continue gabapentin 300 mg 2 times a day   Advised if  symptoms worsen or he develops new symptoms they should let us know.  Follow-up in 6 months or sooner if needed    Ward Givens, MSN, NP-C 06/13/2021, 8:26 AM [facility] [address] [place], [place] [address] [phone]

## 2021-06-13 NOTE — Patient Instructions (Signed)
Your Plan:  Continue Gilenya Blood work today MRI brain and cervical spine If your symptoms worsen or you develop new symptoms please let us know.    Thank you for coming to see [place] at [facility]. I hope we have been able to provide you high quality care today.  You may receive a patient satisfaction survey over the next few weeks. We would appreciate your feedback and comments so that we may continue to improve ourselves and the health of our patients.

## 2021-06-14 ENCOUNTER — Telehealth: Payer: Self-pay | Admitting: Adult Health

## 2021-06-14 LAB — COMPREHENSIVE METABOLIC PANEL
ALT: 11 IU/L (ref 0–44)
AST: 11 IU/L (ref 0–40)
Albumin/Globulin Ratio: 2.2 (ref 1.2–2.2)
Albumin: 4.3 g/dL (ref 3.8–4.9)
Alkaline Phosphatase: 98 IU/L (ref 44–121)
BUN/Creatinine Ratio: 11 (ref 9–20)
BUN: 14 mg/dL (ref 6–24)
Bilirubin Total: 0.3 mg/dL (ref 0.0–1.2)
CO2: 22 mmol/L (ref 20–29)
Calcium: 9.2 mg/dL (ref 8.7–10.2)
Chloride: 105 mmol/L (ref 96–106)
Creatinine, Ser: 1.24 mg/dL (ref 0.76–1.27)
Globulin, Total: 2 g/dL (ref 1.5–4.5)
Glucose: 103 mg/dL — ABNORMAL HIGH (ref 65–99)
Potassium: 4.6 mmol/L (ref 3.5–5.2)
Sodium: 141 mmol/L (ref 134–144)
Total Protein: 6.3 g/dL (ref 6.0–8.5)
eGFR: 70 mL/min/{1.73_m2} (ref >59)

## 2021-06-14 LAB — CBC WITH DIFFERENTIAL/PLATELET
Basophils Absolute: 0.1 10*3/uL (ref 0.0–0.2)
Basos: 2 %
EOS (ABSOLUTE): 0.3 10*3/uL (ref 0.0–0.4)
Eos: 6 %
Hematocrit: 42.1 % (ref 37.5–51.0)
Hemoglobin: 14.2 g/dL (ref 13.0–17.7)
Immature Grans (Abs): 0.1 10*3/uL (ref 0.0–0.1)
Immature Granulocytes: 1 %
Lymphocytes Absolute: 2 10*3/uL (ref 0.7–3.1)
Lymphs: 37 %
MCH: 29.3 pg (ref 26.6–33.0)
MCHC: 33.7 g/dL (ref 31.5–35.7)
MCV: 87 fL (ref 79–97)
Monocytes Absolute: 0.7 10*3/uL (ref 0.1–0.9)
Monocytes: 13 %
Neutrophils Absolute: 2.1 10*3/uL (ref 1.4–7.0)
Neutrophils: 41 %
Platelets: 258 10*3/uL (ref 150–450)
RBC: 4.84 x10E6/uL (ref 4.14–5.80)
RDW: 13.2 % (ref 11.6–15.4)
WBC: 5.3 10*3/uL (ref 3.4–10.8)

## 2021-06-14 NOTE — Telephone Encounter (Signed)
[employer] [employer] Josem Kaufmann: [phone] (exp. 06/14/21 to 07/13/21) faxed to scheduler's with [facility], they will reach out to the patient to schedule.

## 2021-08-21 ENCOUNTER — Encounter: Payer: BC Managed Care – PPO | Admitting: Family Medicine

## 2021-09-05 ENCOUNTER — Ambulatory Visit (INDEPENDENT_AMBULATORY_CARE_PROVIDER_SITE_OTHER): Payer: BC Managed Care – PPO | Admitting: Family Medicine

## 2021-09-05 ENCOUNTER — Encounter: Payer: Self-pay | Admitting: Family Medicine

## 2021-09-05 VITALS — BP 108/78 | HR 78 | Temp 98.8°F | Ht 64.0 in | Wt 144.1 lb

## 2021-09-05 DIAGNOSIS — Z0000 Encounter for general adult medical examination without abnormal findings: Secondary | ICD-10-CM

## 2021-09-05 DIAGNOSIS — Z23 Encounter for immunization: Secondary | ICD-10-CM | POA: Diagnosis not present

## 2021-09-05 DIAGNOSIS — I10 Essential (primary) hypertension: Secondary | ICD-10-CM

## 2021-09-05 LAB — LIPID PANEL
Cholesterol: 192 mg/dL (ref 0–200)
HDL: 39.4 mg/dL (ref >39.00)
LDL Cholesterol: 130 mg/dL — ABNORMAL HIGH (ref 0–99)
NonHDL: 152.88
Total CHOL/HDL Ratio: 5
Triglycerides: 112 mg/dL (ref 0.0–149.0)
VLDL: 22.4 mg/dL (ref 0.0–40.0)

## 2021-09-05 LAB — COMPREHENSIVE METABOLIC PANEL
ALT: 8 U/L (ref 0–53)
AST: 12 U/L (ref 0–37)
Albumin: 4.3 g/dL (ref 3.5–5.2)
Alkaline Phosphatase: 74 U/L (ref 39–117)
BUN: 16 mg/dL (ref 6–23)
CO2: 29 mEq/L (ref 19–32)
Calcium: 9.3 mg/dL (ref 8.4–10.5)
Chloride: 105 mEq/L (ref 96–112)
Creatinine, Ser: 1.32 mg/dL (ref 0.40–1.50)
GFR: 61.36 mL/min (ref >60.00)
Glucose, Bld: 101 mg/dL — ABNORMAL HIGH (ref 70–99)
Potassium: 4.7 mEq/L (ref 3.5–5.1)
Sodium: 141 mEq/L (ref 135–145)
Total Bilirubin: 0.5 mg/dL (ref 0.2–1.2)
Total Protein: 6.5 g/dL (ref 6.0–8.3)

## 2021-09-05 LAB — CBC
HCT: 41.1 % (ref 39.0–52.0)
Hemoglobin: 13.7 g/dL (ref 13.0–17.0)
MCHC: 33.3 g/dL (ref 30.0–36.0)
MCV: 88 fl (ref 78.0–100.0)
Platelets: 311 10*3/uL (ref 150.0–400.0)
RBC: 4.67 Mil/uL (ref 4.22–5.81)
RDW: 13.7 % (ref 11.5–15.5)
WBC: 5.4 10*3/uL (ref 4.0–10.5)

## 2021-09-05 MED ORDER — AMLODIPINE BESYLATE-VALSARTAN 5-320 MG PO TABS: 1.0000 | ORAL_TABLET | Freq: Every day | ORAL | 3 refills | Status: DC

## 2021-09-05 NOTE — Progress Notes (Signed)
Chief Complaint  Patient presents with   Annual Exam    Well Male [name] [name] is here for a complete physical.   His last physical was >1 year ago.  Current diet: in general, a "healthy" diet.  Current exercise: lifting and cardio Weight trend: up a little from last year Fatigue out of ordinary? Tired a lot.  Seat belt? Yes.   Advanced directive? No  Health maintenance Shingrix- No Colonoscopy- Yes Tetanus- Yes HIV- Yes Hep C- Yes   Past Medical History:  Diagnosis Date   Bilateral inguinal hernia    Hyperlipidemia    Hypertension    Multiple sclerosis ([place])    Nephrolithiasis    Neuromuscular disorder ([place])    MS      Past Surgical History:  Procedure Laterality Date   HERNIA REPAIR  08/27/2012   bilateral repair with mesh   NASAL SEPTUM SURGERY  05/30/2018   NASAL SINUS SURGERY  04/03/2021   SQUAMOUS CELL CARCINOMA EXCISION     From the forehead   VASECTOMY      Medications  Current Outpatient Medications on File Prior to Visit  Medication Sig Dispense Refill   amLODipine-valsartan (EXFORGE) 5-320 MG tablet TAKE 1 TABLET DAILY 90 tablet 3   cetirizine (ZYRTEC) 10 MG tablet Take 1 tablet (10 mg total) by mouth daily. 30 tablet 11   Cholecalciferol (VITAMIN D PO) Take 5,000 Units by mouth daily.      Fingolimod HCl (GILENYA) 0.5 MG CAPS Take 1 capsule (0.5 mg total) by mouth daily. 90 capsule 1   fluticasone (FLONASE) 50 MCG/ACT nasal spray USE 2 SPRAYS IN EACH NOSTRIL DAILY 48 g 3   gabapentin (NEURONTIN) 300 MG capsule Take 1 capsule (300 mg total) by mouth 2 (two) times daily. 180 capsule 3   montelukast (SINGULAIR) 10 MG tablet Take 1 tablet (10 mg total) by mouth at bedtime. 90 tablet 3   Multiple Vitamin (MULTIVITAMIN WITH MINERALS) TABS Take 1 tablet by mouth daily.     vitamin B-12 (CYANOCOBALAMIN) 1000 MCG tablet Take 1,000 mcg by mouth daily.     Allergies Allergies  Allergen Reactions   Mold Extract [Trichophyton]     congestion    Penicillins Other (See Comments)    Reaction in childhood. Does not remember.    Family History Family History  Problem Relation Age of Onset   Hypertension Father    Colon polyps Father    Cancer Maternal Grandfather        colon   Colon cancer Neg Hx    Esophageal cancer Neg Hx    Rectal cancer Neg Hx    Stomach cancer Neg Hx     Review of Systems: Constitutional:  no fevers Eye:  no recent significant change in vision Ear/Nose/Mouth/Throat:  Ears:  no hearing loss Nose/Mouth/Throat:  no complaints of nasal congestion, no sore throat Cardiovascular:  no chest pain Respiratory:  no shortness of breath Gastrointestinal: +intermittent constipation GU:  Male: negative for dysuria, frequency Musculoskeletal/Extremities:  no joint pain Integumentary (Skin/Breast):  no abnormal skin lesions reported Neurologic:  no headaches Endocrine: No unexpected weight changes Hematologic/Lymphatic:  no abnormal bleeding  Exam BP 108/78   Pulse 78   Temp 98.8 F (37.1 C) (Oral)   Ht 5\' 4"  (1.626 m)   Wt 144 lb 2 oz (65.4 kg)   SpO2 98%   BMI 24.74 kg/m  General:  well developed, well nourished, in no apparent distress Skin:  no significant moles, warts, or  growths Head:  no masses, lesions, or tenderness Eyes:  pupils equal and round, sclera anicteric without injection Ears:  canals without lesions, TMs shiny without retraction, no obvious effusion, no erythema Nose:  nares patent, septum midline, mucosa normal Throat/Pharynx:  lips and gingiva without lesion; tongue and uvula midline; non-inflamed pharynx; no exudates or postnasal drainage Neck: neck supple without adenopathy, thyromegaly, or masses Cardiac: RRR, no bruits, no LE edema Lungs:  clear to auscultation, breath sounds equal bilaterally, no respiratory distress Abdomen: BS+, soft, non-tender, non-distended, no masses or organomegaly noted Rectal: Deferred Musculoskeletal:  symmetrical muscle groups noted without  atrophy or deformity Neuro:  gait normal; deep tendon reflexes normal and symmetric Psych: well oriented with normal range of affect and appropriate judgment/insight  Assessment and Plan  Well adult exam - Plan: Comprehensive metabolic panel, Lipid panel, CBC  Essential hypertension - Plan: amLODipine-valsartan (EXFORGE) 5-320 MG tablet  Need for influenza vaccination - Plan: Flu Vaccine QUAD 6+ mos PF IM (Fluarix Quad PF)   Well 55 y.o. male. Counseled on diet and exercise. Counseled on risks and benefits of prostate cancer screening with PSA. The patient agrees to forego testing. Immunizations, labs, and further orders as above. Flu shot today. Covid bivalent booster rec'd for 90 d after last date of s/s's.  Shingrix rec'd. Advanced directive form provided.  Follow up in 6 mo or prn. The patient voiced understanding and agreement to the plan.  [place], DO 09/05/21 8:38 AM

## 2021-09-05 NOTE — Patient Instructions (Addendum)
Give [place] 2-3 business days to get the results of your labs back.   Keep the diet clean and stay active.  The new Shingrix vaccine (for shingles) is a 2 shot series. It can make people feel low energy, achy and almost like they have the flu for 48 hours after injection. Please plan accordingly when deciding on when to get this shot. Call our office for a nurse visit appointment to get this. The second shot of the series is less severe regarding the side effects, but it still lasts 48 hours.   Ok to consider the updated covid booster after 90 days of your last day of symptoms from [place].   Let us know if you need anything.

## 2021-09-28 ENCOUNTER — Telehealth: Payer: Self-pay

## 2021-09-28 NOTE — Telephone Encounter (Signed)
PA for Gilenya has been sent via fax to [place].  Confirmation of PA was received.

## 2021-09-28 NOTE — Telephone Encounter (Signed)
PA for Gilenya has been approved . Approval dates are 09/28/2021-09/28/2022  Will make patient aware through [facility]

## 2021-10-02 ENCOUNTER — Other Ambulatory Visit: Payer: Self-pay | Admitting: Adult Health

## 2021-10-02 NOTE — Telephone Encounter (Signed)
[facility] asking if we can go ahead and call in the [place] states its supposed to be delivered to pt tomorrow.

## 2021-10-02 NOTE — Telephone Encounter (Addendum)
Upon review, this was e-scribed by our office and receipt confirmed by [facility] at 1:27 PM today.

## 2022-02-08 ENCOUNTER — Other Ambulatory Visit: Payer: Self-pay | Admitting: Adult Health

## 2022-03-06 ENCOUNTER — Ambulatory Visit: Payer: BC Managed Care – PPO | Admitting: Family Medicine

## 2022-03-06 ENCOUNTER — Other Ambulatory Visit: Payer: Self-pay | Admitting: Family Medicine

## 2022-03-06 ENCOUNTER — Encounter: Payer: Self-pay | Admitting: Family Medicine

## 2022-03-06 VITALS — BP 110/68 | HR 72 | Temp 98.1°F | Ht 64.0 in | Wt 146.5 lb

## 2022-03-06 DIAGNOSIS — E7800 Pure hypercholesterolemia, unspecified: Secondary | ICD-10-CM

## 2022-03-06 DIAGNOSIS — I10 Essential (primary) hypertension: Secondary | ICD-10-CM

## 2022-03-06 DIAGNOSIS — G35 Multiple sclerosis: Secondary | ICD-10-CM

## 2022-03-06 DIAGNOSIS — R7303 Prediabetes: Secondary | ICD-10-CM

## 2022-03-06 LAB — LIPID PANEL
Cholesterol: 202 mg/dL — ABNORMAL HIGH (ref 0–200)
HDL: 54.3 mg/dL (ref >39.00)
LDL Cholesterol: 113 mg/dL — ABNORMAL HIGH (ref 0–99)
NonHDL: 147.71
Total CHOL/HDL Ratio: 4
Triglycerides: 176 mg/dL — ABNORMAL HIGH (ref 0.0–149.0)
VLDL: 35.2 mg/dL (ref 0.0–40.0)

## 2022-03-06 LAB — HEMOGLOBIN A1C: Hgb A1c MFr Bld: 6.2 % (ref 4.6–6.5)

## 2022-03-06 NOTE — Patient Instructions (Addendum)
Give [place] 2-3 business days to get the results of your labs back.  If cholesterol is high, I will order the scan of your chest as discussed.   Keep the diet clean and stay active.  Please get me a copy of your advanced directive form at your convenience.   Let us know if you need anything.

## 2022-03-06 NOTE — Progress Notes (Signed)
Chief Complaint  Patient presents with   Annual Exam    Subjective [name] [name] is a 56 y.o. male who presents for hypertension follow up. He does not monitor home blood pressures. He is compliant with medications- Exforge 5-320 mg/d. Patient has these side effects of medication: none He is usually adhering to a healthy diet overall. Current exercise: lifting weights, cardio No Cp or SOB.    Past Medical History:  Diagnosis Date   Bilateral inguinal hernia    Hyperlipidemia    Hypertension    Multiple sclerosis ([facility])    Nephrolithiasis    Neuromuscular disorder ([facility])    MS    Exam BP 110/68   Pulse 72   Temp 98.1 F (36.7 C) (Oral)   Ht '5\' 4"'$  (1.626 m)   Wt 146 lb 8 oz (66.5 kg)   SpO2 99%   BMI 25.15 kg/m  General:  well developed, well nourished, in no apparent distress Heart: RRR, no bruits, no LE edema Lungs: clear to auscultation, no accessory muscle use Psych: well oriented with normal range of affect and appropriate judgment/insight  Essential hypertension  Pure hypercholesterolemia - Plan: Lipid panel  Prediabetes - Plan: Hemoglobin A1c  Multiple sclerosis (HCC), Chronic  Chronic, stable.  Continue Exforge 5-320 mg daily.  Counseled on diet and exercise. Check labs today, if still elevated, will consider a coronary artery calcium score which we discussed today. F/u in 6 months for physical or as needed. The patient voiced understanding and agreement to the plan.  [place], DO 03/06/22  10:04 AM

## 2022-03-09 ENCOUNTER — Telehealth (HOSPITAL_BASED_OUTPATIENT_CLINIC_OR_DEPARTMENT_OTHER): Payer: Self-pay

## 2022-03-12 ENCOUNTER — Telehealth (HOSPITAL_BASED_OUTPATIENT_CLINIC_OR_DEPARTMENT_OTHER): Payer: Self-pay

## 2022-03-15 ENCOUNTER — Ambulatory Visit (HOSPITAL_BASED_OUTPATIENT_CLINIC_OR_DEPARTMENT_OTHER)
Admission: RE | Admit: 2022-03-15 | Discharge: 2022-03-15 | Disposition: A | Discharge status: Home / Self Care | Payer: BC Managed Care – PPO | Source: Ambulatory Visit | Attending: Family Medicine | Admitting: Family Medicine

## 2022-03-15 DIAGNOSIS — E7800 Pure hypercholesterolemia, unspecified: Secondary | ICD-10-CM

## 2022-03-28 DIAGNOSIS — L820 Inflamed seborrheic keratosis: Secondary | ICD-10-CM | POA: Diagnosis not present

## 2022-03-28 DIAGNOSIS — Z85828 Personal history of other malignant neoplasm of skin: Secondary | ICD-10-CM | POA: Diagnosis not present

## 2022-03-28 DIAGNOSIS — D225 Melanocytic nevi of trunk: Secondary | ICD-10-CM | POA: Diagnosis not present

## 2022-03-28 DIAGNOSIS — D2262 Melanocytic nevi of left upper limb, including shoulder: Secondary | ICD-10-CM | POA: Diagnosis not present

## 2022-03-28 DIAGNOSIS — D2261 Melanocytic nevi of right upper limb, including shoulder: Secondary | ICD-10-CM | POA: Diagnosis not present

## 2022-05-31 ENCOUNTER — Other Ambulatory Visit: Payer: Self-pay | Admitting: Adult Health

## 2022-05-31 ENCOUNTER — Encounter: Payer: Self-pay | Admitting: Family Medicine

## 2022-06-13 ENCOUNTER — Ambulatory Visit: Payer: BC Managed Care – PPO | Admitting: Adult Health

## 2022-07-10 ENCOUNTER — Other Ambulatory Visit: Payer: Self-pay | Admitting: *Deleted

## 2022-07-10 ENCOUNTER — Encounter: Payer: Self-pay | Admitting: Adult Health

## 2022-07-10 MED ORDER — FINGOLIMOD HCL 0.5 MG PO CAPS: ORAL_CAPSULE | ORAL | 1 refills | Status: DC

## 2022-08-02 ENCOUNTER — Other Ambulatory Visit: Payer: Self-pay | Admitting: Family Medicine

## 2022-08-02 DIAGNOSIS — I10 Essential (primary) hypertension: Secondary | ICD-10-CM

## 2022-09-10 ENCOUNTER — Ambulatory Visit (INDEPENDENT_AMBULATORY_CARE_PROVIDER_SITE_OTHER): Payer: BC Managed Care – PPO | Admitting: Family Medicine

## 2022-09-10 ENCOUNTER — Encounter: Payer: Self-pay | Admitting: Family Medicine

## 2022-09-10 VITALS — BP 118/70 | HR 72 | Temp 97.9°F | Ht 65.0 in | Wt 148.4 lb

## 2022-09-10 DIAGNOSIS — Z0000 Encounter for general adult medical examination without abnormal findings: Secondary | ICD-10-CM

## 2022-09-10 DIAGNOSIS — Z23 Encounter for immunization: Secondary | ICD-10-CM | POA: Diagnosis not present

## 2022-09-10 DIAGNOSIS — Z125 Encounter for screening for malignant neoplasm of prostate: Secondary | ICD-10-CM

## 2022-09-10 LAB — CBC
HCT: 40.8 % (ref 39.0–52.0)
Hemoglobin: 13.9 g/dL (ref 13.0–17.0)
MCHC: 34.1 g/dL (ref 30.0–36.0)
MCV: 87.8 fl (ref 78.0–100.0)
Platelets: 269 10*3/uL (ref 150.0–400.0)
RBC: 4.64 Mil/uL (ref 4.22–5.81)
RDW: 13.8 % (ref 11.5–15.5)
WBC: 4.4 10*3/uL (ref 4.0–10.5)

## 2022-09-10 LAB — COMPREHENSIVE METABOLIC PANEL
ALT: 17 U/L (ref 0–53)
AST: 14 U/L (ref 0–37)
Albumin: 4.4 g/dL (ref 3.5–5.2)
Alkaline Phosphatase: 80 U/L (ref 39–117)
BUN: 16 mg/dL (ref 6–23)
CO2: 29 mEq/L (ref 19–32)
Calcium: 9.2 mg/dL (ref 8.4–10.5)
Chloride: 105 mEq/L (ref 96–112)
Creatinine, Ser: 1.31 mg/dL (ref 0.40–1.50)
GFR: 61.48 mL/min (ref >60.00)
Glucose, Bld: 99 mg/dL (ref 70–99)
Potassium: 4.7 mEq/L (ref 3.5–5.1)
Sodium: 142 mEq/L (ref 135–145)
Total Bilirubin: 0.5 mg/dL (ref 0.2–1.2)
Total Protein: 6.4 g/dL (ref 6.0–8.3)

## 2022-09-10 LAB — LIPID PANEL
Cholesterol: 207 mg/dL — ABNORMAL HIGH (ref 0–200)
HDL: 39.7 mg/dL (ref >39.00)
NonHDL: 167.38
Total CHOL/HDL Ratio: 5
Triglycerides: 206 mg/dL — ABNORMAL HIGH (ref 0.0–149.0)
VLDL: 41.2 mg/dL — ABNORMAL HIGH (ref 0.0–40.0)

## 2022-09-10 LAB — LDL CHOLESTEROL, DIRECT: Direct LDL: 140 mg/dL

## 2022-09-10 LAB — PSA: PSA: 0.32 ng/mL (ref 0.10–4.00)

## 2022-09-10 NOTE — Progress Notes (Signed)
Chief Complaint  Patient presents with   Annual Exam    Well Male [name] [name] is here for a complete physical.   His last physical was >1 year ago.  Current diet: in general, a "healthy" diet.  Current exercise: lifting wts Weight trend: stable Fatigue out of ordinary? No. Seat belt? Yes.   Advanced directive? No  Health maintenance Shingrix- No Colonoscopy- Yes Tetanus- Yes HIV- Yes Hep C- Yes   Past Medical History:  Diagnosis Date   Bilateral inguinal hernia    Hyperlipidemia    Hypertension    Multiple sclerosis ([place])    Nephrolithiasis    Neuromuscular disorder ([place])    MS      Past Surgical History:  Procedure Laterality Date   HERNIA REPAIR  08/27/2012   bilateral repair with mesh   NASAL SEPTUM SURGERY  05/30/2018   NASAL SINUS SURGERY  04/03/2021   SQUAMOUS CELL CARCINOMA EXCISION     From the forehead   VASECTOMY     Medications  Current Outpatient Medications on File Prior to Visit  Medication Sig Dispense Refill   amLODipine-valsartan (EXFORGE) 5-320 MG tablet TAKE 1 TABLET BY MOUTH EVERY DAY 90 tablet 1   cetirizine (ZYRTEC) 10 MG tablet Take 1 tablet (10 mg total) by mouth daily. 30 tablet 11   Cholecalciferol (VITAMIN D PO) Take 5,000 Units by mouth daily.      Fingolimod HCl 0.5 MG CAPS TAKE 1 CAPSULE BY MOUTH 1 TIME A DAY 90 capsule 1   gabapentin (NEURONTIN) 300 MG capsule TAKE 1 CAPSULE BY MOUTH TWICE A DAY 180 capsule 3   Multiple Vitamin (MULTIVITAMIN WITH MINERALS) TABS Take 1 tablet by mouth daily.     vitamin B-12 (CYANOCOBALAMIN) 1000 MCG tablet Take 1,000 mcg by mouth daily.     Allergies Allergies  Allergen Reactions   Mold Extract [Trichophyton]     congestion   Penicillins Other (See Comments)    Reaction in childhood. Does not remember.   Family History Family History  Problem Relation Age of Onset   Hypertension Father    Colon polyps Father    Cancer Maternal Grandfather        colon   Colon cancer Neg Hx     Esophageal cancer Neg Hx    Rectal cancer Neg Hx    Stomach cancer Neg Hx    Review of Systems: Constitutional:  no fevers Eye:  no recent significant change in vision Ear/Nose/Mouth/Throat:  Ears:  no hearing loss Nose/Mouth/Throat:  no complaints of nasal congestion, no sore throat Cardiovascular:  no chest pain Respiratory:  no shortness of breath Gastrointestinal:  no change in bowel habits GU:  Male: negative for dysuria, frequency Musculoskeletal/Extremities:  no joint pain Integumentary (Skin/Breast):  no abnormal skin lesions reported Neurologic:  no headaches Endocrine: No unexpected weight changes Hematologic/Lymphatic:  no abnormal bleeding  Exam BP 118/70 (BP Location: Left Arm, Patient Position: Sitting, Cuff Size: Normal)   Pulse 72   Temp 97.9 F (36.6 C) (Oral)   Ht '5\' 5"'$  (1.651 m)   Wt 148 lb 6 oz (67.3 kg)   SpO2 97%   BMI 24.69 kg/m  General:  well developed, well nourished, in no apparent distress Skin:  no significant moles, warts, or growths Head:  no masses, lesions, or tenderness Eyes:  pupils equal and round, sclera anicteric without injection Ears:  canals without lesions, TMs shiny without retraction, no obvious effusion, no erythema Nose:  nares patent, mucosa normal  Throat/Pharynx:  lips and gingiva without lesion; tongue and uvula midline; non-inflamed pharynx; no exudates or postnasal drainage Neck: neck supple without adenopathy, thyromegaly, or masses Cardiac: RRR, no bruits, no LE edema Lungs:  clear to auscultation, breath sounds equal bilaterally, no respiratory distress Abdomen: BS+, soft, non-tender, non-distended, no masses or organomegaly noted Rectal: Deferred Musculoskeletal:  symmetrical muscle groups noted without atrophy or deformity Neuro:  gait normal; deep tendon reflexes normal and symmetric Psych: well oriented with normal range of affect and appropriate judgment/insight  Assessment and Plan  Well adult exam - Plan: CBC,  Comprehensive metabolic panel, Lipid panel  Screening for prostate cancer - Plan: PSA   Well 56 y.o. male. Counseled on diet and exercise. Counseled on risks and benefits of prostate cancer screening with PSA. The patient agrees to undergo testing. Immunizations, labs, and further orders as above. Advanced directive form provided today.  Shingrix rec'd. Covid vaccine discussed.  Flu shot today.  Follow up in 6 mo or prn. The patient voiced understanding and agreement to the plan.  [place], DO 09/10/22 8:42 AM

## 2022-09-10 NOTE — Addendum Note (Signed)
Addended by: Sharon Seller B on: 09/10/2022 08:43 AM   Modules accepted: Orders

## 2022-09-10 NOTE — Patient Instructions (Addendum)
Give us 2-3 business days to get the results of your labs back.   Keep the diet clean and stay active.  Please get me a copy of your advanced directive form at your convenience.   The Shingrix vaccine (for shingles) is a 2 shot series spaced 2-6 months apart. It can make people feel low energy, achy and almost like they have the flu for 48 hours after injection. 1/5 people can have nausea and/or vomiting. Please plan accordingly when deciding on when to get this shot. Call our office for a nurse visit appointment to get this. The second shot of the series is less severe regarding the side effects, but it still lasts 48 hours.   Let us know if you need anything.  

## 2022-09-12 ENCOUNTER — Other Ambulatory Visit: Payer: Self-pay | Admitting: Family Medicine

## 2022-09-12 DIAGNOSIS — E7800 Pure hypercholesterolemia, unspecified: Secondary | ICD-10-CM

## 2022-09-19 ENCOUNTER — Encounter: Payer: Self-pay | Admitting: Family Medicine

## 2022-10-10 ENCOUNTER — Encounter: Payer: Self-pay | Admitting: Adult Health

## 2022-10-11 NOTE — Telephone Encounter (Signed)
[facility] [phone] for fingolimod refill.  Was told needed a PA first.  Initiated [place] [phone] for Fingolimod 0.'5mg'$  caps (take one daily).  Determination 24-72 hours.  Will fax to [place] [phone].  There is refill available.

## 2022-10-17 MED ORDER — FINGOLIMOD HCL 0.5 MG PO CAPS: ORAL_CAPSULE | ORAL | 1 refills | Status: DC

## 2022-10-17 NOTE — Addendum Note (Signed)
Addended by: Brandon Melnick on: 10/17/2022 12:19 PM   Modules accepted: Orders

## 2022-10-24 ENCOUNTER — Other Ambulatory Visit (INDEPENDENT_AMBULATORY_CARE_PROVIDER_SITE_OTHER): Payer: BC Managed Care – PPO

## 2022-10-24 DIAGNOSIS — E7800 Pure hypercholesterolemia, unspecified: Secondary | ICD-10-CM | POA: Diagnosis not present

## 2022-10-24 LAB — LIPID PANEL
Cholesterol: 209 mg/dL — ABNORMAL HIGH (ref 0–200)
HDL: 37 mg/dL — ABNORMAL LOW (ref >39.00)
LDL Cholesterol: 139 mg/dL — ABNORMAL HIGH (ref 0–99)
NonHDL: 171.5
Total CHOL/HDL Ratio: 6
Triglycerides: 161 mg/dL — ABNORMAL HIGH (ref 0.0–149.0)
VLDL: 32.2 mg/dL (ref 0.0–40.0)

## 2022-12-18 ENCOUNTER — Encounter: Payer: Self-pay | Admitting: Adult Health

## 2022-12-18 ENCOUNTER — Ambulatory Visit: Payer: BC Managed Care – PPO | Admitting: Adult Health

## 2022-12-18 VITALS — BP 106/72 | HR 69 | Ht 64.0 in | Wt 140.0 lb

## 2022-12-18 DIAGNOSIS — G35 Multiple sclerosis: Secondary | ICD-10-CM

## 2022-12-18 DIAGNOSIS — R202 Paresthesia of skin: Secondary | ICD-10-CM | POA: Diagnosis not present

## 2022-12-18 DIAGNOSIS — R200 Anesthesia of skin: Secondary | ICD-10-CM

## 2022-12-18 NOTE — Progress Notes (Signed)
PATIENT: [name] [name] DOB: 1965/10/21  REASON FOR VISIT: follow up HISTORY FROM: patient  Chief Complaint  Patient presents with   Follow-up    Pt in 55   Pt here for MS f/u Pt states both hands have increased numbness      HISTORY OF PRESENT ILLNESS: Today 12/18/22:  [name] [name] is a 57 y.o. male with a history of Multiple Sclerosis. Returns today for follow-up.    He remains on Gilenya.  States that it continues to work well for him.  Denies any new symptoms.  No change in his bowels or bladder.  No new  weakness.  No change in his gait or balance.  Reports increased numbness in the upper extremities-reports that the numbness is primarily isolated to the fingertips.  He states occasionally when he wakes up during the night both arms will feel slightly numb.  Remains on gabapentin 300 mg twice a day.  Went to see opthamalogy- reports testing was good.     12/07/20: [name] [name] is a 9-yea month old male with a history of multiple sclerosis.  He returns today for follow-up.  Overall he feels that he has remained stable.  Continues on Gilenya.  Denies any changes with the bowels or bladder.  No new changes with his gait or balance.  No new numbness or weakness.  Continues on gabapentin 300 mg twice a day  12/07/20: [name] [name] is a 57 year old male with a history of multiple sclerosis.  He returns today for follow-up.  He remains on Gilenya.  He denies any new numbness or weakness.  Denies any changes with his gait or balance.  No changes with his vision.  No changes with the bowels or bladder.  Reports that he continues to take gabapentin-reports that he usually takes it twice a day instead of 3 times a day.  He returns today for an evaluation.  12/08/19: [name] [name] is a 57 year old male with a history of multiple sclerosis.  He returns today for follow-up.  He continues on Gilenya.  He reports that he does have ongoing numbness in the hands.  He feels that over the last year may  have gotten worse.  The last 2 digits he feels is worse regarding the numbness.  Especially when he wakes up in the morning.  He denies any changes with his vision.  No changes with the bowels or bladder.  No new weakness.  No significant changes in his gait or balance.  He returns today for an evaluation.  HISTORY 09/11/18 [name] [name] is a 57 year old male with a history of multiple sclerosis.  He returns today for follow-up.  He remains on Gilenya and is tolerating it well.  He was scheduled to have MRI of the brain and cervical spine at the last visit he reports that he forgot to call [facility] imaging.  He has not completed the scans yet.  He denies any new numbness or weakness.  No change in his gait or balance.  No change in his vision.  No change in the bowels or bladder.  He returns today for evaluation.  REVIEW OF SYSTEMS: Out of a complete 14 system review of symptoms, the patient complains only of the following symptoms, and all other reviewed systems are negative.  See HPI  ALLERGIES: Allergies  Allergen Reactions   Mold Extract [Trichophyton]     congestion   Penicillins Other (See Comments)    Reaction in childhood. Does not remember.  HOME MEDICATIONS: Outpatient Medications Prior to Visit  Medication Sig Dispense Refill   amLODipine-valsartan (EXFORGE) 5-320 MG tablet TAKE 1 TABLET BY MOUTH EVERY DAY 90 tablet 1   cetirizine (ZYRTEC) 10 MG tablet Take 1 tablet (10 mg total) by mouth daily. 30 tablet 11   Cholecalciferol (VITAMIN D PO) Take 5,000 Units by mouth daily.      Fingolimod HCl 0.5 MG CAPS TAKE 1 CAPSULE BY MOUTH 1 TIME A DAY 90 capsule 1   gabapentin (NEURONTIN) 300 MG capsule TAKE 1 CAPSULE BY MOUTH TWICE A DAY 180 capsule 3   Multiple Vitamin (MULTIVITAMIN WITH MINERALS) TABS Take 1 tablet by mouth daily.     vitamin B-12 (CYANOCOBALAMIN) 1000 MCG tablet Take 1,000 mcg by mouth daily.     Facility-Administered Medications Prior to Visit  Medication Dose  Route Frequency Provider Last Rate Last Admin   gadopentetate dimeglumine (MAGNEVIST) injection 14 mL  14 mL Intravenous Once PRN Kathrynn Ducking, MD        PAST MEDICAL HISTORY: Past Medical History:  Diagnosis Date   Bilateral inguinal hernia    Hyperlipidemia    Hypertension    Multiple sclerosis ([place])    Nephrolithiasis    Neuromuscular disorder ([place])    MS    PAST SURGICAL HISTORY: Past Surgical History:  Procedure Laterality Date   HERNIA REPAIR  08/27/2012   bilateral repair with mesh   NASAL SEPTUM SURGERY  05/30/2018   NASAL SINUS SURGERY  04/03/2021   SQUAMOUS CELL CARCINOMA EXCISION     From the forehead   VASECTOMY      FAMILY HISTORY: Family History  Problem Relation Age of Onset   Hypertension Father    Colon polyps Father    Cancer Maternal Grandfather        colon   Colon cancer Neg Hx    Esophageal cancer Neg Hx    Rectal cancer Neg Hx    Stomach cancer Neg Hx     SOCIAL HISTORY: Social History   Socioeconomic History   Marital status: Married    Spouse name: Not on file   Number of children: 3   Years of education: College   Highest education level: Not on file  Occupational History    Employer: [employer]  Tobacco Use   Smoking status: Never   Smokeless tobacco: Never  Vaping Use   Vaping Use: Never used  Substance and Sexual Activity   Alcohol use: Yes    Alcohol/week: 0.0 standard drinks of alcohol    Comment: OCC   Drug use: No   Sexual activity: Yes    Partners: Female  Other Topics Concern   Not on file  Social History Narrative   Patient lives at home with his wife Lattie Haw) and two daughters.   Patient works full time.   Education college.   Right handed.   Caffeine three sodas daily.   Social Determinants of Health   Financial Resource Strain: Not on file  Food Insecurity: Not on file  Transportation Needs: Not on file  Physical Activity: Not on file  Stress: Not on file  Social Connections: Not on file  Intimate  Partner Violence: Not on file      PHYSICAL EXAM  Vitals:   12/18/22 1045  BP: 106/72  Pulse: 69  Weight: 140 lb (63.5 kg)  Height: 5\' 4"  (1.626 m)    Body mass index is 24.03 kg/m.  Generalized: Well developed, in no acute distress   Neurological  examination  Mentation: Alert oriented to time, place, history taking. Follows all commands speech and language fluent Cranial nerve II-XII: Pupils were equal round reactive to light. Extraocular movements were full, visual field were full on confrontational test.  Head turning and shoulder shrug  were normal and symmetric. Motor: The motor testing reveals 5 over 5 strength of all 4 extremities. Good symmetric motor tone is noted throughout.  Sensory: Sensory testing is intact to soft touch on all 4 extremities. No evidence of extinction is noted.  Coordination: Cerebellar testing reveals good finger-nose-finger and heel-to-shin bilaterally.  Gait and station: Gait is normal.   Reflexes: Deep tendon reflexes are symmetric and normal bilaterally.   DIAGNOSTIC DATA (LABS, IMAGING, TESTING) - I reviewed patient records, labs, notes, testing and imaging myself where available.  Lab Results  Component Value Date   WBC 4.4 09/10/2022   HGB 13.9 09/10/2022   HCT 40.8 09/10/2022   MCV 87.8 09/10/2022   PLT 269.0 09/10/2022      Component Value Date/Time   NA 142 09/10/2022 0842   NA 141 06/13/2021 0852   K 4.7 09/10/2022 0842   CL 105 09/10/2022 0842   CO2 29 09/10/2022 0842   GLUCOSE 99 09/10/2022 0842   BUN 16 09/10/2022 0842   BUN 14 06/13/2021 0852   CREATININE 1.31 09/10/2022 0842   CALCIUM 9.2 09/10/2022 0842   PROT 6.4 09/10/2022 0842   PROT 6.3 06/13/2021 0852   ALBUMIN 4.4 09/10/2022 0842   ALBUMIN 4.3 06/13/2021 0852   AST 14 09/10/2022 0842   ALT 17 09/10/2022 0842   ALKPHOS 80 09/10/2022 0842   BILITOT 0.5 09/10/2022 0842   BILITOT 0.3 06/13/2021 0852   GFRNONAA 64 12/08/2019 0841   GFRAA 74 12/08/2019 0841    Lab Results  Component Value Date   CHOL 209 (H) 10/24/2022   HDL 37.00 (L) 10/24/2022   LDLCALC 139 (H) 10/24/2022   LDLDIRECT 140.0 09/10/2022   TRIG 161.0 (H) 10/24/2022   CHOLHDL 6 10/24/2022   Lab Results  Component Value Date   HGBA1C 6.2 03/06/2022     ASSESSMENT AND PLAN 57 y.o. year old male  has a past medical history of Bilateral inguinal hernia, Hyperlipidemia, Hypertension, Multiple sclerosis ([place]), Nephrolithiasis, and Neuromuscular disorder ([place]). here with :  1.  Multiple sclerosis 2.  Numbness and tingling in the upper extremities   -Continue Gilenya -Reviewed blood work in [facility] -MRI brain and Cervical spine to monitor progression of MS -Continue gabapentin 300 mg 2 times a day   Advised if symptoms worsen or he develops new symptoms they should let us know.  Follow-up in 6 months or sooner if needed    Ward Givens, MSN, NP-C 12/18/2022, 10:45 AM [facility] [address] [place], [place] [address] [phone]

## 2022-12-19 ENCOUNTER — Telehealth: Payer: Self-pay | Admitting: Adult Health

## 2022-12-19 NOTE — Telephone Encounter (Signed)
Pt scheduled for MR brain w/wo and MR cervical spine w/wo contrast at [facility] for 12/26/22 at [facility] auth# [id number] (12/19/22-01/17/23)

## 2022-12-20 ENCOUNTER — Encounter: Payer: Self-pay | Admitting: Adult Health

## 2022-12-26 ENCOUNTER — Ambulatory Visit (INDEPENDENT_AMBULATORY_CARE_PROVIDER_SITE_OTHER): Payer: BC Managed Care – PPO

## 2022-12-26 DIAGNOSIS — G35 Multiple sclerosis: Secondary | ICD-10-CM

## 2022-12-26 MED ORDER — GADOBENATE DIMEGLUMINE 529 MG/ML IV SOLN
10.0000 mL | Freq: Once | INTRAVENOUS | Status: AC | PRN
  Administered 2022-12-26: 10 mL via INTRAVENOUS

## 2022-12-26 NOTE — Progress Notes (Signed)
Brain MRI is stable from his previous MRI in 2019 with no new lesions

## 2022-12-27 ENCOUNTER — Telehealth: Payer: Self-pay | Admitting: *Deleted

## 2022-12-27 NOTE — Telephone Encounter (Signed)
-----   Message from Genia Harold, MD sent at 12/26/2022  4:39 PM EDT ----- Brain MRI is stable from his previous MRI in 2019 with no new lesions

## 2023-01-21 DIAGNOSIS — R430 Anosmia: Secondary | ICD-10-CM | POA: Diagnosis not present

## 2023-02-05 ENCOUNTER — Other Ambulatory Visit: Payer: Self-pay | Admitting: Adult Health

## 2023-02-06 ENCOUNTER — Other Ambulatory Visit: Payer: Self-pay | Admitting: Family Medicine

## 2023-02-06 DIAGNOSIS — I10 Essential (primary) hypertension: Secondary | ICD-10-CM

## 2023-03-12 ENCOUNTER — Ambulatory Visit: Payer: BC Managed Care – PPO | Admitting: Family Medicine

## 2023-03-12 ENCOUNTER — Encounter: Payer: Self-pay | Admitting: Family Medicine

## 2023-03-12 VITALS — BP 109/70 | HR 69 | Temp 98.0°F | Ht 64.0 in | Wt 142.5 lb

## 2023-03-12 DIAGNOSIS — I10 Essential (primary) hypertension: Secondary | ICD-10-CM

## 2023-03-12 DIAGNOSIS — R7303 Prediabetes: Secondary | ICD-10-CM | POA: Diagnosis not present

## 2023-03-12 DIAGNOSIS — E7800 Pure hypercholesterolemia, unspecified: Secondary | ICD-10-CM | POA: Diagnosis not present

## 2023-03-12 DIAGNOSIS — M25511 Pain in right shoulder: Secondary | ICD-10-CM

## 2023-03-12 LAB — LIPID PANEL
Cholesterol: 189 mg/dL (ref 0–200)
HDL: 40.2 mg/dL (ref >39.00)
LDL Cholesterol: 125 mg/dL — ABNORMAL HIGH (ref 0–99)
NonHDL: 148.63
Total CHOL/HDL Ratio: 5
Triglycerides: 120 mg/dL (ref 0.0–149.0)
VLDL: 24 mg/dL (ref 0.0–40.0)

## 2023-03-12 LAB — HEMOGLOBIN A1C: Hgb A1c MFr Bld: 6.4 % (ref 4.6–6.5)

## 2023-03-12 NOTE — Patient Instructions (Addendum)
Give [place] 2-3 business days to get the results of your labs back.   Keep the diet clean and stay active. Consider adding more routine cardio.   Heat (pad or rice pillow in microwave) over affected area, 10-15 minutes twice daily.   Ice/cold pack over area for 10-15 min twice daily.  Send me a message in 1 month if no significant improvement with the shoulder.   Let us know if you need anything.  EXERCISES  RANGE OF MOTION (ROM) AND STRETCHING EXERCISES These exercises may help you when beginning to rehabilitate your injury. While completing these exercises, remember:  Restoring tissue flexibility helps normal motion to return to the joints. This allows healthier, less painful movement and activity. An effective stretch should be held for at least 30 seconds. A stretch should never be painful. You should only feel a gentle lengthening or release in the stretched tissue.  ROM - Pendulum Bend at the waist so that your right / left arm falls away from your body. Support yourself with your opposite hand on a solid surface, such as a table or a countertop. Your right / left arm should be perpendicular to the ground. If it is not perpendicular, you need to lean over farther. Relax the muscles in your right / left arm and shoulder as much as possible. Gently sway your hips and trunk so they move your right / left arm without any use of your right / left shoulder muscles. Progress your movements so that your right / left arm moves side to side, then forward and backward, and finally, both clockwise and counterclockwise. Complete 10-15 repetitions in each direction. Many people use this exercise to relieve discomfort in their shoulder as well as to gain range of motion. Repeat 2 times. Complete this exercise 3 times per week.  STRETCH - Flexion, Standing Stand with good posture. With an underhand grip on your right / left hand and an overhand grip on the opposite hand, grasp a broomstick or cane so  that your hands are a little more than shoulder-width apart. Keeping your right / left elbow straight and shoulder muscles relaxed, push the stick with your opposite hand to raise your right / left arm in front of your body and then overhead. Raise your arm until you feel a stretch in your right / left shoulder, but before you have increased shoulder pain. Try to avoid shrugging your right / left shoulder as your arm rises by keeping your shoulder blade tucked down and toward your mid-back spine. Hold 30 seconds. Slowly return to the starting position. Repeat 2 times. Complete this exercise 3 times per week.  STRETCH - Internal Rotation Place your right / left hand behind your back, palm-up. Throw a towel or belt over your opposite shoulder. Grasp the towel/belt with your right / left hand. While keeping an upright posture, gently pull up on the towel/belt until you feel a stretch in the front of your right / left shoulder. Avoid shrugging your right / left shoulder as your arm rises by keeping your shoulder blade tucked down and toward your mid-back spine. Hold 30. Release the stretch by lowering your opposite hand. Repeat 2 times. Complete this exercise 3 times per week.  STRETCH - External Rotation and Abduction Stagger your stance through a doorframe. It does not matter which foot is forward. As instructed by your physician, physical therapist or athletic trainer, place your hands: And forearms above your head and on the door frame. And forearms  at head-height and on the door frame. At elbow-height and on the door frame. Keeping your head and chest upright and your stomach muscles tight to prevent over-extending your low-back, slowly shift your weight onto your front foot until you feel a stretch across your chest and/or in the front of your shoulders. Hold 30 seconds. Shift your weight to your back foot to release the stretch. Repeat 2 times. Complete this stretch 3 times per week.    STRENGTHENING EXERCISES  These exercises may help you when beginning to rehabilitate your injury. They may resolve your symptoms with or without further involvement from your physician, physical therapist or athletic trainer. While completing these exercises, remember:  Muscles can gain both the endurance and the strength needed for everyday activities through controlled exercises. Complete these exercises as instructed by your physician, physical therapist or athletic trainer. Progress the resistance and repetitions only as guided. You may experience muscle soreness or fatigue, but the pain or discomfort you are trying to eliminate should never worsen during these exercises. If this pain does worsen, stop and make certain you are following the directions exactly. If the pain is still present after adjustments, discontinue the exercise until you can discuss the trouble with your clinician. If advised by your physician, during your recovery, avoid activity or exercises which involve actions that place your right / left hand or elbow above your head or behind your back or head. These positions stress the tissues which are trying to heal.  STRENGTH - Scapular Depression and Adduction With good posture, sit on a firm chair. Supported your arms in front of you with pillows, arm rests or a table top. Have your elbows in line with the sides of your body. Gently draw your shoulder blades down and toward your mid-back spine. Gradually increase the tension without tensing the muscles along the top of your shoulders and the back of your neck. Hold for 3 seconds. Slowly release the tension and relax your muscles completely before completing the next repetition. After you have practiced this exercise, remove the arm support and complete it in standing as well as sitting. Repeat 2 times. Complete this exercise 3 times per week.   STRENGTH - External Rotators Secure a rubber exercise band/tubing to a fixed  object so that it is at the same height as your right / left elbow when you are standing or sitting on a firm surface. Stand or sit so that the secured exercise band/tubing is at your side that is not injured. Bend your elbow 90 degrees. Place a folded towel or small pillow under your right / left arm so that your elbow is a few inches away from your side. Keeping the tension on the exercise band/tubing, pull it away from your body, as if pivoting on your elbow. Be sure to keep your body steady so that the movement is only coming from your shoulder rotating. Hold 3 seconds. Release the tension in a controlled manner as you return to the starting position. Repeat 2 times. Complete this exercise 3 times per week.   STRENGTH - Supraspinatus Stand or sit with good posture. Grasp a 2-3 lb weight or an exercise band/tubing so that your hand is "thumbs-up," like when you shake hands. Slowly lift your right / left hand from your thigh into the air, traveling about 30 degrees from straight out at your side. Lift your hand to shoulder height or as far as you can without increasing any shoulder pain. Initially, many people  do not lift their hands above shoulder height. Avoid shrugging your right / left shoulder as your arm rises by keeping your shoulder blade tucked down and toward your mid-back spine. Hold for 3 seconds. Control the descent of your hand as you slowly return to your starting position. Repeat 2 times. Complete this exercise 3 times per week.   STRENGTH - Shoulder Extensors Secure a rubber exercise band/tubing so that it is at the height of your shoulders when you are either standing or sitting on a firm arm-less chair. With a thumbs-up grip, grasp an end of the band/tubing in each hand. Straighten your elbows and lift your hands straight in front of you at shoulder height. Step back away from the secured end of band/tubing until it becomes tense. Squeezing your shoulder blades together, pull  your hands down to the sides of your thighs. Do not allow your hands to go behind you. Hold for 3 seconds. Slowly ease the tension on the band/tubing as you reverse the directions and return to the starting position. Repeat 2 times. Complete this exercise 3 times per week.   STRENGTH - Scapular Retractors Secure a rubber exercise band/tubing so that it is at the height of your shoulders when you are either standing or sitting on a firm arm-less chair. With a palm-down grip, grasp an end of the band/tubing in each hand. Straighten your elbows and lift your hands straight in front of you at shoulder height. Step back away from the secured end of band/tubing until it becomes tense. Squeezing your shoulder blades together, draw your elbows back as you bend them. Keep your upper arm lifted away from your body throughout the exercise. Hold 3 seconds. Slowly ease the tension on the band/tubing as you reverse the directions and return to the starting position. Repeat 2 times. Complete this exercise 3 times per week.  STRENGTH - Scapular Depressors Find a sturdy chair without wheels, such as a from a dining room table. Keeping your feet on the floor, lift your bottom from the seat and lock your elbows. Keeping your elbows straight, allow gravity to pull your body weight down. Your shoulders will rise toward your ears. Raise your body against gravity by drawing your shoulder blades down your back, shortening the distance between your shoulders and ears. Although your feet should always maintain contact with the floor, your feet should progressively support less body weight as you get stronger. Hold 3 seconds. In a controlled and slow manner, lower your body weight to begin the next repetition. Repeat 2 times. Complete this exercise 3 times per week.    This information is not intended to replace advice given to you by your health care provider. Make sure you discuss any questions you have with your health  care provider.   Document Released: 07/25/2005 Document Revised: 10/01/2014 Document Reviewed: 12/23/2008 Elsevier Interactive Patient Education 2016 [employer]  Biceps Tendon Disruption (Proximal) Rehab Do exercises exactly as told by your health care provider and adjust them as directed. It is normal to feel mild stretching, pulling, tightness, or discomfort as you do these exercises, but you should stop right away if you feel sudden pain or your pain gets worse.  Stretching and range of motion exercises These exercises warm up your muscles and joints and improve the movement and flexibility of your arm and shoulder. These exercises also help to relieve pain and stiffness. Exercise A: Shoulder flexion, standing   Stand facing a wall. Put your left / right  hand on the wall. Slide your left / right hand up the wall. Stop when you feel a stretch in your shoulder, or when you reach the angle recommended by your health care provider. Use your other hand to help raise your arm, if needed. As your hand gets higher, you may need to step closer to the wall. Avoid shrugging your shoulder while you raise your arm. To do this, keep your shoulder blade tucked down toward your spine. Hold for 30 seconds. Slowly return to the starting position. Use your other arm to help, if needed. Repeat 2 times. Complete this exercise 3 times per week. Exercise B: Pendulum   Stand near a wall or a surface that you can hold onto for balance. Bend at the waist and let your left / right arm hang straight down. Use your other arm to support you. Relax your arm and shoulder muscles, and move your hips and your trunk so your left / right arm swings freely. Your arm should swing because of the motion of your body, not because you are using your arm or shoulder muscles. Keep moving so your arm swings in the following directions, as told by your health care provider: Side to side. Forward and backward. In clockwise and  counterclockwise circles. Slowly return to the starting position. Repeat 2 times. Complete this exercise 3 times per week.  Strengthening exercises These exercises build strength and endurance in your arm and shoulder. Endurance is the ability to use your muscles for a long time, even after your muscles get tired. Exercise C: Elbow flexion, neutral  Sit on a stable chair without armrests, or stand. Hold a 3-5 lb weight in your left / right hand, or hold an exercise band with both hands. Your palms should face each other at the starting position. Bend your left / right elbow and move your hand up toward your shoulder. Lead with your thumb, and keep your palm facing the same direction. Keep your other arm straight down, in the starting position. Slowly return to the starting position. Repeat 2-3 times. Complete this exercise 3 times per week. Exercise D: Forearm supination   Sit with your left / right forearm on a table. Your elbow should be below shoulder height. Rest your hand over the edge of the table so your palm faces down. If directed, hold a hammer with your left / right hand. Without moving your elbow, slowly rotate your hand so your palm faces up toward the ceiling. If you are holding a hammer, begin by holding the hammer near the head. When this exercise gets easier for you, hold the hammer farther down the handle. Hold for 3 seconds. Slowly return to the starting position. Repeat 2 times. Complete this exercise 3 times per week. Exercise E: Scapular retraction   Sit in a stable chair without armrests, or stand. Secure an exercise band to a stable object in front of you so the band is at shoulder height. Hold one end of the exercise band in each hand. Squeeze your shoulder blades together and move your elbows slightly behind you. Do not shrug your shoulders. Hold for 3 seconds. Slowly return to the starting position. Repeat 2 times. Complete this exercise 3 times per  week. Exercise F: Scapular protraction, supine   Lie on your back on a firm surface. Hold a 3-5 lb weight in your left / right hand. Raise your left / right arm straight into the air so your hand is directly above your  shoulder joint. Push the weight into the air so your shoulder lifts off of the surface that you are lying on. Do not move your head, neck, or back. Hold for 3 seconds. Slowly return to the starting position. Let your muscles relax completely before you repeat this exercise. Repeat 2 times. Complete this exercise 3 times per week. This information is not intended to replace advice given to you by your health care provider. Make sure you discuss any questions you have with your health care provider. Document Released: 09/10/2005 Document Revised: 05/17/2016 Document Reviewed: 08/19/2015 Elsevier Interactive Patient Education  2017 [employer].

## 2023-03-12 NOTE — Progress Notes (Signed)
Chief Complaint  Patient presents with   Follow-up    Subjective [name] [name] is a 57 y.o. male who presents for hypertension follow up. He does not monitor home blood pressures. He is compliant with medications- Exforge 5-320 mg/d. Patient has these side effects of medication: none He is usually adhering to a healthy diet overall. Current exercise: lifting wts No CP or SOB.   R shoulder pain Anterior R shoulder pain for 2 mo. Started after doing lateral raises.  He does not have any pain unless he moves it in abduction.  No decreased range of motion, bruising, swelling, redness.  Has not tried thing at home so far other than activity modification as he is not putting as much weight on his lifts.  There is no catching or locking.   Past Medical History:  Diagnosis Date   Bilateral inguinal hernia    Hyperlipidemia    Hypertension    Multiple sclerosis (HCC)    Nephrolithiasis    Neuromuscular disorder (HCC)    MS    Exam BP 109/70 (BP Location: Left Arm, Patient Position: Sitting, Cuff Size: Normal)   Pulse 69   Temp 98 F (36.7 C) (Oral)   Ht 5\' 4"  (1.626 m)   Wt 142 lb 8 oz (64.6 kg)   SpO2 98%   BMI 24.46 kg/m  General:  well developed, well nourished, in no apparent distress Heart: RRR, no bruits, no LE edema Lungs: clear to auscultation, no accessory muscle use Right shoulder: Normal active and passive range of motion.  Positive Neer's, Hawkins, empty can on the right.  Negative O'Brien's, crossover, speeds, liftoff Psych: well oriented with normal range of affect and appropriate judgment/insight  Essential hypertension  Pure hypercholesterolemia - Plan: Lipid panel  Prediabetes - Plan: Hemoglobin A1c  Right shoulder pain, unspecified chronicity  Chronic, stable.  Continue Exforge 5-320 mg daily.  Counseled on diet and exercise. Check labs Check labs Suspect supraspinatus and possibly biceps tendon.  Stretches and exercises provided.  If no improvement  the next month, would question diagnosis and refer to sports medicine for further diagnosis. F/u in 6 months for physical or as needed. The patient voiced understanding and agreement to the plan.  Jilda Roche [place], DO 03/12/23  8:43 AM

## 2023-03-15 ENCOUNTER — Encounter: Payer: Self-pay | Admitting: Family Medicine

## 2023-04-30 DIAGNOSIS — D2261 Melanocytic nevi of right upper limb, including shoulder: Secondary | ICD-10-CM | POA: Diagnosis not present

## 2023-04-30 DIAGNOSIS — D485 Neoplasm of uncertain behavior of skin: Secondary | ICD-10-CM | POA: Diagnosis not present

## 2023-04-30 DIAGNOSIS — L570 Actinic keratosis: Secondary | ICD-10-CM | POA: Diagnosis not present

## 2023-04-30 DIAGNOSIS — L821 Other seborrheic keratosis: Secondary | ICD-10-CM | POA: Diagnosis not present

## 2023-04-30 DIAGNOSIS — D225 Melanocytic nevi of trunk: Secondary | ICD-10-CM | POA: Diagnosis not present

## 2023-04-30 DIAGNOSIS — Z85828 Personal history of other malignant neoplasm of skin: Secondary | ICD-10-CM | POA: Diagnosis not present

## 2023-05-16 ENCOUNTER — Other Ambulatory Visit: Payer: Self-pay | Admitting: Adult Health

## 2023-05-21 NOTE — Telephone Encounter (Signed)
Pt called wanting to know when this will be sent in to be filled. Please advise.

## 2023-05-22 ENCOUNTER — Encounter: Payer: Self-pay | Admitting: Adult Health

## 2023-06-26 ENCOUNTER — Telehealth: Payer: Self-pay | Admitting: *Deleted

## 2023-06-26 NOTE — Telephone Encounter (Signed)
Pt Cook [facility] form @ the front desk for p/u

## 2023-07-22 ENCOUNTER — Ambulatory Visit: Payer: BC Managed Care – PPO | Admitting: Adult Health

## 2023-07-22 VITALS — BP 132/90 | HR 71 | Ht 64.0 in | Wt 146.0 lb

## 2023-07-22 DIAGNOSIS — R200 Anesthesia of skin: Secondary | ICD-10-CM | POA: Diagnosis not present

## 2023-07-22 DIAGNOSIS — R0683 Snoring: Secondary | ICD-10-CM

## 2023-07-22 DIAGNOSIS — G479 Sleep disorder, unspecified: Secondary | ICD-10-CM | POA: Diagnosis not present

## 2023-07-22 DIAGNOSIS — G35 Multiple sclerosis: Secondary | ICD-10-CM

## 2023-07-22 DIAGNOSIS — R202 Paresthesia of skin: Secondary | ICD-10-CM

## 2023-07-22 NOTE — Progress Notes (Signed)
PATIENT: [name] [name] DOB: 08-23-1966  REASON FOR VISIT: follow up HISTORY FROM: patient  Chief Complaint  Patient presents with   Follow-up    Rm 19, alone. Doing ok, except for increased numbness and cramps both feet (alternate).  Taking fingolimod daily.       HISTORY OF PRESENT ILLNESS: Today 07/22/23:  [name] [name] is a 57 y.o. male with a history of Multiple sclerosis. Returns today for follow-up. Remains on Gilenya. No changes with bowels or bladder. Reports some changes with vision- blurry at times- opthalmology is changing prescription. No change in gait or balance.  Reports increased numbness in hands-All fingers. Numbness was  there when he was initially diagnosed with MS.  Reports Cramps in the bottom on the feet. alternates feets. May happened a few days in a row then will go weeks. Reports that he doesn't sleep well. Will fall asleep easily but then wake up and can't go back to sleep. He will just stay up the rest of the night. Reports that his wife states that he snores very loudly. Reports two nasal surgeries for deviated septum and for polyps. Returns today for follow-up.    12/18/22: [name] [name] is a 57 y.o. male with a history of Multiple Sclerosis. Returns today for follow-up.    He remains on Gilenya.  States that it continues to work well for him.  Denies any new symptoms.  No change in his bowels or bladder.  No new  weakness.  No change in his gait or balance.  Reports increased numbness in the upper extremities-reports that the numbness is primarily isolated to the fingertips.  He states occasionally when he wakes up during the night both arms will feel slightly numb.  Remains on gabapentin 300 mg twice a day.  Went to see opthamalogy- reports testing was good.   12/07/20: [name] [name] is a 78-yea month old male with a history of multiple sclerosis.  He returns today for follow-up.  Overall he feels that he has remained stable.  Continues on Gilenya.   Denies any changes with the bowels or bladder.  No new changes with his gait or balance.  No new numbness or weakness.  Continues on gabapentin 300 mg twice a day  12/07/20: [name] [name] is a 57 year old male with a history of multiple sclerosis.  He returns today for follow-up.  He remains on Gilenya.  He denies any new numbness or weakness.  Denies any changes with his gait or balance.  No changes with his vision.  No changes with the bowels or bladder.  Reports that he continues to take gabapentin-reports that he usually takes it twice a day instead of 3 times a day.  He returns today for an evaluation.  12/08/19: [name] [name] is a 57 year old male with a history of multiple sclerosis.  He returns today for follow-up.  He continues on Gilenya.  He reports that he does have ongoing numbness in the hands.  He feels that over the last year may have gotten worse.  The last 2 digits he feels is worse regarding the numbness.  Especially when he wakes up in the morning.  He denies any changes with his vision.  No changes with the bowels or bladder.  No new weakness.  No significant changes in his gait or balance.  He returns today for an evaluation.  HISTORY 09/11/18 [name] [name] is a 57 year old male with a history of multiple sclerosis.  He returns today for follow-up.  He remains on Gilenya and is tolerating it well.  He was scheduled to have MRI of the brain and cervical spine at the last visit he reports that he forgot to call [facility] imaging.  He has not completed the scans yet.  He denies any new numbness or weakness.  No change in his gait or balance.  No change in his vision.  No change in the bowels or bladder.  He returns today for evaluation.  REVIEW OF SYSTEMS: Out of a complete 14 system review of symptoms, the patient complains only of the following symptoms, and all other reviewed systems are negative.  See HPI  ALLERGIES: Allergies  Allergen Reactions   Mold Extract [Trichophyton]      congestion   Penicillins Other (See Comments)    Reaction in childhood. Does not remember.    HOME MEDICATIONS: Outpatient Medications Prior to Visit  Medication Sig Dispense Refill   amLODipine-valsartan (EXFORGE) 5-320 MG tablet TAKE 1 TABLET BY MOUTH EVERY DAY 90 tablet 1   cetirizine (ZYRTEC) 10 MG tablet Take 1 tablet (10 mg total) by mouth daily. 30 tablet 11   Cholecalciferol (VITAMIN D PO) Take 5,000 Units by mouth daily.      Fingolimod HCl 0.5 MG CAPS TAKE 1 CAPSULE BY MOUTH 1 TIME A DAY 90 capsule 1   gabapentin (NEURONTIN) 300 MG capsule TAKE 1 CAPSULE BY MOUTH TWICE A DAY 180 capsule 1   Multiple Vitamin (MULTIVITAMIN WITH MINERALS) TABS Take 1 tablet by mouth daily.     vitamin B-12 (CYANOCOBALAMIN) 1000 MCG tablet Take 1,000 mcg by mouth daily.     Facility-Administered Medications Prior to Visit  Medication Dose Route Frequency Provider Last Rate Last Admin   gadopentetate dimeglumine (MAGNEVIST) injection 14 mL  14 mL Intravenous Once PRN [name] Spaniel, MD        PAST MEDICAL HISTORY: Past Medical History:  Diagnosis Date   Bilateral inguinal hernia    Hyperlipidemia    Hypertension    Multiple sclerosis (HCC)    Nephrolithiasis    Neuromuscular disorder (HCC)    MS    PAST SURGICAL HISTORY: Past Surgical History:  Procedure Laterality Date   HERNIA REPAIR  08/27/2012   bilateral repair with mesh   NASAL SEPTUM SURGERY  05/30/2018   NASAL SINUS SURGERY  04/03/2021   SQUAMOUS CELL CARCINOMA EXCISION     From the forehead   VASECTOMY      FAMILY HISTORY: Family History  Problem Relation Age of Onset   Hypertension Father    Colon polyps Father    Cancer Maternal Grandfather        colon   Colon cancer Neg Hx    Esophageal cancer Neg Hx    Rectal cancer Neg Hx    Stomach cancer Neg Hx    Multiple sclerosis Neg Hx     SOCIAL HISTORY: Social History   Socioeconomic History   Marital status: Married    Spouse name: Not on file   Number  of children: 3   Years of education: College   Highest education level: Not on file  Occupational History    Employer: [employer]  Tobacco Use   Smoking status: Never   Smokeless tobacco: Never  Vaping Use   Vaping status: Never Used  Substance and Sexual Activity   Alcohol use: Yes    Alcohol/week: 0.0 standard drinks of alcohol    Comment: OCC   Drug use: No   Sexual activity: Yes  Partners: Female  Other Topics Concern   Not on file  Social History Narrative   Patient lives at home with his wife Misty Stanley) and two daughters.   Patient works full time.   Education college.   Right handed.   Caffeine three sodas daily.   Social Determinants of Health   Financial Resource Strain: Not on file  Food Insecurity: Not on file  Transportation Needs: Not on file  Physical Activity: Not on file  Stress: Not on file  Social Connections: Not on file  Intimate Partner Violence: Not on file      PHYSICAL EXAM  Vitals:   07/22/23 1047  BP: (!) 132/90  Pulse: 71  SpO2: 98%  Weight: 146 lb (66.2 kg)  Height: 5\' 4"  (1.626 m)    Body mass index is 25.06 kg/m.  Generalized: Well developed, in no acute distress   Neurological examination  Mentation: Alert oriented to time, place, history taking. Follows all commands speech and language fluent Cranial nerve II-XII: Pupils were equal round reactive to light. Extraocular movements were full, visual field were full on confrontational test.  Head turning and shoulder shrug  were normal and symmetric. Motor: The motor testing reveals 5 over 5 strength of all 4 extremities. Good symmetric motor tone is noted throughout.  Sensory: Sensory testing is intact to soft touch on all 4 extremities. No evidence of extinction is noted.  Coordination: Cerebellar testing reveals good finger-nose-finger and heel-to-shin bilaterally.  Gait and station: Gait is normal.   Reflexes: Deep tendon reflexes are symmetric and normal bilaterally.    DIAGNOSTIC DATA (LABS, IMAGING, TESTING) - I reviewed patient records, labs, notes, testing and imaging myself where available.  Lab Results  Component Value Date   WBC 4.4 09/10/2022   HGB 13.9 09/10/2022   HCT 40.8 09/10/2022   MCV 87.8 09/10/2022   PLT 269.0 09/10/2022      Component Value Date/Time   NA 142 09/10/2022 0842   NA 141 06/13/2021 0852   K 4.7 09/10/2022 0842   CL 105 09/10/2022 0842   CO2 29 09/10/2022 0842   GLUCOSE 99 09/10/2022 0842   BUN 16 09/10/2022 0842   BUN 14 06/13/2021 0852   CREATININE 1.31 09/10/2022 0842   CALCIUM 9.2 09/10/2022 0842   PROT 6.4 09/10/2022 0842   PROT 6.3 06/13/2021 0852   ALBUMIN 4.4 09/10/2022 0842   ALBUMIN 4.3 06/13/2021 0852   AST 14 09/10/2022 0842   ALT 17 09/10/2022 0842   ALKPHOS 80 09/10/2022 0842   BILITOT 0.5 09/10/2022 0842   BILITOT 0.3 06/13/2021 0852   GFRNONAA 64 12/08/2019 0841   GFRAA 74 12/08/2019 0841   Lab Results  Component Value Date   CHOL 189 03/12/2023   HDL 40.20 03/12/2023   LDLCALC 125 (H) 03/12/2023   LDLDIRECT 140.0 09/10/2022   TRIG 120.0 03/12/2023   CHOLHDL 5 03/12/2023   Lab Results  Component Value Date   HGBA1C 6.4 03/12/2023     ASSESSMENT AND PLAN 57 y.o. year old male  has a past medical history of Bilateral inguinal hernia, Hyperlipidemia, Hypertension, Multiple sclerosis (HCC), Nephrolithiasis, and Neuromuscular disorder (HCC). here with :  1.  Multiple sclerosis 2.  Numbness and tingling in the upper extremities  3. Snoring/ sleep disturbance  -Continue Gilenya -will be having blood work with PCP in 2 weeks -Continue gabapentin 300 mg 2 times a day - referral for sleep evaluation to r/o OSA  Advised if symptoms worsen or he develops new  symptoms they should let us know.  Follow-up in 6 months or sooner if needed    Butch Penny, MSN, NP-C 07/22/2023, 11:12 AM [facility] [address], [address] [place], [place] [address] [phone]

## 2023-07-30 ENCOUNTER — Encounter: Payer: Self-pay | Admitting: Neurology

## 2023-07-31 ENCOUNTER — Other Ambulatory Visit: Payer: Self-pay | Admitting: Family Medicine

## 2023-07-31 DIAGNOSIS — I10 Essential (primary) hypertension: Secondary | ICD-10-CM

## 2023-09-02 ENCOUNTER — Encounter: Payer: Self-pay | Admitting: Neurology

## 2023-09-02 ENCOUNTER — Ambulatory Visit (INDEPENDENT_AMBULATORY_CARE_PROVIDER_SITE_OTHER): Payer: BC Managed Care – PPO | Admitting: Neurology

## 2023-09-02 VITALS — BP 138/84 | HR 75 | Ht 64.0 in | Wt 148.6 lb

## 2023-09-02 DIAGNOSIS — R0683 Snoring: Secondary | ICD-10-CM | POA: Diagnosis not present

## 2023-09-02 DIAGNOSIS — G479 Sleep disorder, unspecified: Secondary | ICD-10-CM | POA: Diagnosis not present

## 2023-09-02 DIAGNOSIS — G35 Multiple sclerosis: Secondary | ICD-10-CM | POA: Diagnosis not present

## 2023-09-02 DIAGNOSIS — R202 Paresthesia of skin: Secondary | ICD-10-CM

## 2023-09-02 DIAGNOSIS — R200 Anesthesia of skin: Secondary | ICD-10-CM

## 2023-09-02 NOTE — Patient Instructions (Signed)
ASSESSMENT AND PLAN 57 y.o. year old male  here with:     1) history of well controlled MS- Primary neurologist is not noted on preceding notes.    2) no sleepiness and no fatigue but insomnia with unwanted early arousals, snoring and risk factors for OSA are retrognathia, nasal septal deviation, sinusitis,    3) HST ,    4) consider short acting sonata for unwanted early end of nocturnal sleep. It can be taken at the time of his bathroom break and he would need to refrain from operating machinery for 4 hours , not 8. hours

## 2023-09-02 NOTE — Progress Notes (Signed)
SLEEP MEDICINE CLINIC    Provider:  Melvyn Novas, MD  Primary Care Physician:  Sharlene Dory, DO [address] [place] [address]     Referring Provider: Butch Penny, Np [address] [place],  [place] [address]    Primary Neurologist : ?         Chief Complaint according to patient   Patient presents with:     New Patient (Initial Visit)           HISTORY OF PRESENT ILLNESS:  [name] [name] is a 57 y.o. male patient who is seen upon referral on 09/02/2023 from MM, NP  for a sleep evaluation.  Chief concern according to patient :  here to see haw to get longer sleep, more restorative sleep.    I have the pleasure of seeing [name] [name] 09/02/23 a right-handed male with a possible sleep disorder.  Never had a sleep study. Today 12/18/22:   [name] [name] is a 57 y.o. male with a history of Multiple Sclerosis. Returns today for follow-up.    He remains on Gilenya.  States that it continues to work well for him.  Denies any new symptoms.  No change in his bowels or bladder.  No new  weakness.  No change in his gait or balance.  Reports increased numbness in the upper extremities-reports that the numbness is primarily isolated to the fingertips.  He states occasionally when he wakes up during the night both arms will feel slightly numb.  Remains on gabapentin 300 mg twice a day.   Went to see opthamalogy- reports testing was good.        12/07/20: [name] [name] is a 58-yea month old male with a history of multiple sclerosis.  He returns today for follow-up.  Overall he feels that he has remained stable.  Continues on Gilenya.  Denies any changes with the bowels or bladder.  No new changes with his gait or balance.  No new numbness or weakness.  Continues on gabapentin 300 mg twice a day   12/07/20: [name] [name] is a 57 year old male with a history of multiple sclerosis.  He returns today for follow-up.  He remains on Gilenya.  He  denies any new numbness or weakness.  Denies any changes with his gait or balance.  No changes with his vision.  No changes with the bowels or bladder.  Reports that he continues to take gabapentin-reports that he usually takes it twice a day instead of 3 times a day.  He returns today for an evaluation.   12/08/19: [name] [name] is a 57 year old male with a history of multiple sclerosis.  He returns today for follow-up.  He continues on Gilenya.  He reports that he does have ongoing numbness in the hands.  He feels that over the last year may have gotten worse.  The last 2 digits he feels is worse regarding the numbness.  Especially when he wakes up in the morning.  He denies any changes with his vision.  No changes with the bowels or bladder.  No new weakness.  No significant changes in his gait or balance.  He returns today for an evaluation.   HISTORY 09/11/18 [name] [name] is a 57 year old male with a history of multiple sclerosis.  He returns today for follow-up.  He remains on Gilenya and is tolerating it well.  He was scheduled to have MRI of the brain and cervical spine  at the last visit he reports that he forgot to call [facility] imaging.  He has not completed the scans yet.  He denies any new numbness or weakness.  No change in his gait or balance.  No change in his vision.  No change in the bowels or bladder.      The patient had no previous sleep study .    Sleep relevant medical history: Nocturia 0-1, no hx of Sleep walking, Night terrors, other Parasomnia , sinus surgery, polypectomy,  deviated septum repair- ENT surgeon Onalee Hua more, MD     Family medical Verne Spurr history: no other family member on CPAP with OSA, insomnia, sleep walkers.    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 The patient currently works from home in daytime. Pets are present- one dog.  Tobacco use: none .  ETOH use ;  socially 2/ drinks or less a week,  Caffeine intake in form of Coffee( /) Soda( 2-3/d) Tea ( /) or energy drinks Exercise in form of gym.    Sleep habits are as follows: The patient's dinner time is between 7-.7.30 PM. The patient goes to bed at 9.30 PM and reads, goes easily to sleep- 5-6 hours of sleep , wakes for 0-1 bathroom breaks, the first time at 2-3 AM.  He feels wide awake after that .   The preferred sleep position is laterally, with the support of 1-2 pillows.  Dreams are reportedly frequent.   The patient wakes up spontaneously before the alarm. 4.30  AM is the usual rise time. He goes to the gym at that time.  He reports not feeling refreshed or restored in AM, with symptoms such as dry mouth, morning headaches, and residual fatigue. Naps are not taken.   Review of Systems: Out of a complete 14 system review, the patient complains of only the following symptoms, and all other reviewed systems are negative.:  Fatigue, sleepiness , snoring, staying asleep is harder- no trouble with falling asleep. No GERD  not RLS but leg burning in evenings some spasms. Hands and planta pedia.    How likely are you to doze in the following situations: 0 = not likely, 1 = slight chance, 2 = moderate chance, 3 = high chance   Sitting and Reading? Watching Television? Sitting inactive in a public place (theater or meeting)? As a passenger in a car for an hour without a break? Lying down in the afternoon when circumstances permit? Sitting and talking to someone? Sitting quietly after lunch without alcohol? In a car, while stopped for a few minutes in traffic?   Total = 4/ 24 points   FSS endorsed at 9/ 63 points.   Social History   Socioeconomic History   Marital status: Married    Spouse name: Not on file   Number of children: 3   Years of education: College   Highest education level: Not on file  Occupational History    Employer: [employer]  Tobacco Use   Smoking status: Never    Smokeless tobacco: Never  Vaping Use   Vaping status: Never Used  Substance and Sexual Activity   Alcohol use: Yes    Alcohol/week: 0.0 standard drinks of alcohol    Comment: OCC   Drug use: No   Sexual activity: Yes    Partners: Female  Other Topics Concern   Not on file  Social History Narrative  Patient lives at home with his wife Misty Stanley) and two daughters.   Patient works full time.   Education college.   Right handed.   Caffeine three sodas daily.   Social Determinants of Health   Financial Resource Strain: Not on file  Food Insecurity: Not on file  Transportation Needs: Not on file  Physical Activity: Not on file  Stress: Not on file  Social Connections: Not on file    Family History  Problem Relation Age of Onset   Hypertension Father    Colon polyps Father    Cancer Maternal Grandfather        colon   Colon cancer Neg Hx    Esophageal cancer Neg Hx    Rectal cancer Neg Hx    Stomach cancer Neg Hx    Multiple sclerosis Neg Hx     Past Medical History:  Diagnosis Date   Bilateral inguinal hernia    Hyperlipidemia    Hypertension    Multiple sclerosis (HCC)    Nephrolithiasis    Neuromuscular disorder (HCC)    MS    Past Surgical History:  Procedure Laterality Date   HERNIA REPAIR  08/27/2012   bilateral repair with mesh   NASAL SEPTUM SURGERY  05/30/2018   NASAL SINUS SURGERY  04/03/2021   SQUAMOUS CELL CARCINOMA EXCISION     From the forehead   VASECTOMY       Current Outpatient Medications on File Prior to Visit  Medication Sig Dispense Refill   amLODipine-valsartan (EXFORGE) 5-320 MG tablet TAKE 1 TABLET BY MOUTH EVERY DAY 90 tablet 1   cetirizine (ZYRTEC) 10 MG tablet Take 1 tablet (10 mg total) by mouth daily. 30 tablet 11   Cholecalciferol (VITAMIN D PO) Take 5,000 Units by mouth daily.      Fingolimod HCl 0.5 MG CAPS TAKE 1 CAPSULE BY MOUTH 1 TIME A DAY 90 capsule 1   gabapentin (NEURONTIN) 300 MG capsule TAKE 1 CAPSULE BY MOUTH TWICE  A DAY 180 capsule 1   Multiple Vitamin (MULTIVITAMIN WITH MINERALS) TABS Take 1 tablet by mouth daily.     vitamin B-12 (CYANOCOBALAMIN) 1000 MCG tablet Take 1,000 mcg by mouth daily.     Current Facility-Administered Medications on File Prior to Visit  Medication Dose Route Frequency Provider Last Rate Last Admin   gadopentetate dimeglumine (MAGNEVIST) injection 14 mL  14 mL Intravenous Once PRN York Spaniel, MD        Allergies  Allergen Reactions   Mold Extract [Trichophyton]     congestion   Penicillins Other (See Comments)    Reaction in childhood. Does not remember.     DIAGNOSTIC DATA (LABS, IMAGING, TESTING) - I reviewed patient records, labs, notes, testing and imaging myself where available.  Lab Results  Component Value Date   WBC 4.4 09/10/2022   HGB 13.9 09/10/2022   HCT 40.8 09/10/2022   MCV 87.8 09/10/2022   PLT 269.0 09/10/2022      Component Value Date/Time   NA 142 09/10/2022 0842   NA 141 06/13/2021 0852   K 4.7 09/10/2022 0842   CL 105 09/10/2022 0842   CO2 29 09/10/2022 0842   GLUCOSE 99 09/10/2022 0842   BUN 16 09/10/2022 0842   BUN 14 06/13/2021 0852   CREATININE 1.31 09/10/2022 0842   CALCIUM 9.2 09/10/2022 0842   PROT 6.4 09/10/2022 0842   PROT 6.3 06/13/2021 0852   ALBUMIN 4.4 09/10/2022 0842   ALBUMIN 4.3 06/13/2021 7829  AST 14 09/10/2022 0842   ALT 17 09/10/2022 0842   ALKPHOS 80 09/10/2022 0842   BILITOT 0.5 09/10/2022 0842   BILITOT 0.3 06/13/2021 0852   GFRNONAA 64 12/08/2019 0841   GFRAA 74 12/08/2019 0841   Lab Results  Component Value Date   CHOL 189 03/12/2023   HDL 40.20 03/12/2023   LDLCALC 125 (H) 03/12/2023   LDLDIRECT 140.0 09/10/2022   TRIG 120.0 03/12/2023   CHOLHDL 5 03/12/2023   Lab Results  Component Value Date   HGBA1C 6.4 03/12/2023   No results found for: "VITAMINB12" No results found for: "TSH"  PHYSICAL EXAM:  Today's Vitals   09/02/23 0747  BP: 138/84  Pulse: 75  Weight: 148 lb 9.6 oz  (67.4 kg)  Height: 5\' 4"  (1.626 m)   Body mass index is 25.51 kg/m.   Wt Readings from Last 3 Encounters:  09/02/23 148 lb 9.6 oz (67.4 kg)  07/22/23 146 lb (66.2 kg)  03/12/23 142 lb 8 oz (64.6 kg)     Ht Readings from Last 3 Encounters:  09/02/23 5\' 4"  (1.626 m)  07/22/23 5\' 4"  (1.626 m)  03/12/23 5\' 4"  (1.626 m)      General: The patient is awake, alert and appears not in acute distress. The patient is well groomed. Head: Normocephalic, atraumatic. Neck is supple. Mallampati 2- stubby uvula with lateral crowding.  neck circumference:14.5 inches . Nasal airflow partly patent.  2 major  ENT surgeries prior to Covid, has seasonal anosmia.  Retrognathia is seen.  Dental status: biological.  Cardiovascular:  Regular rate and cardiac rhythm by pulse,  without distended neck veins. Respiratory: Lungs are clear to auscultation.  Skin:  Without evidence of ankle edema, or rash. Trunk: The patient's posture is erect.   NEUROLOGIC EXAM: The patient is awake and alert, oriented to place and time.   Memory subjective described as intact.  Attention span & concentration ability appears normal.  Speech is fluent,  without  dysarthria, dysphonia or aphasia.  Mood and affect are appropriate.   Cranial nerves: no loss of smell or taste reported  Pupils are equal and briskly reactive to light. Funduscopic exam deferred.  Extraocular movements in vertical and horizontal planes were intact and without nystagmus. No Diplopia. Visual fields by finger perimetry are intact. Hearing was intact to soft voice and finger rubbing.    Facial sensation intact to fine touch.  Facial motor strength is symmetric and tongue and uvula move midline.  Neck ROM : rotation, tilt and flexion extension were normal for age and shoulder shrug was symmetrical.    Motor exam:  Symmetric bulk, tone and ROM.   Normal tone without cog wheeling, symmetric grip strength .   Sensory:  Fine touch and vibration were  tested  and  normal. Reports fingertip numbness, tingling.  Proprioception tested in the upper extremities was normal.   Coordination: Rapid alternating movements in the fingers/hands were of normal speed.  The Finger-to-nose maneuver was intact without evidence of ataxia, dysmetria or tremor.   Gait and station: Patient could rise unassisted from a seated position, walked without assistive device.  Stance is of normal width/ base and the patient turned with 3 steps.  Toe and heel walk were deferred.  Deep tendon reflexes: in the  upper and lower extremities are symmetric and intact.  Babinski response was deferred.    ASSESSMENT AND PLAN 57 y.o. year old male  here with:    1) history of well controlled MS- Primary  neurologist is not noted on preceding notes.   2) no sleepiness and no fatigue but insomnia with unwanted early arousals, snoring and risk factors for OSA are retrognathia, nasal septal deviation, sinusitis,   3) HST ,   4) consider short acting sonata for unwanted early end of nocturnal sleep. It can be taken at the time of his bathroom break and he would need to refrain from operating machinery for 4 hours , not 8. hours     I plan to follow up either personally or through our NP within 4 months, only if HST is positive for OSA or other physiological sleep disorder. .   I would like to thank Sharlene Dory, DO and [place], [place], [address] [place],  [place] [address] for allowing me to meet with and to take care of this pleasant patient.   CC: I will share my notes with Dr Epimenio Foot , MD, PhD  After spending a total time of  45  minutes face to face and additional time for physical and neurologic examination, review of laboratory studies,  personal review of imaging studies, reports and results of other testing and review of referral information / records as far as provided in visit,   Electronically signed by: Melvyn Novas, MD 09/02/2023 8:25  AM  [facility] Board certified by The [employer] of Sleep Medicine and Diplomate of the [employer] of Sleep Medicine. Board certified In Neurology through the ABPN, Fellow of the [employer] of Neurology.

## 2023-09-16 ENCOUNTER — Encounter: Payer: BC Managed Care – PPO | Admitting: Family Medicine

## 2023-09-16 ENCOUNTER — Encounter: Payer: Self-pay | Admitting: Family Medicine

## 2023-09-16 ENCOUNTER — Ambulatory Visit: Payer: BC Managed Care – PPO | Admitting: Family Medicine

## 2023-09-16 VITALS — BP 118/70 | HR 69 | Temp 98.7°F | Resp 18 | Ht 64.0 in | Wt 147.0 lb

## 2023-09-16 DIAGNOSIS — R7303 Prediabetes: Secondary | ICD-10-CM | POA: Diagnosis not present

## 2023-09-16 DIAGNOSIS — Z0000 Encounter for general adult medical examination without abnormal findings: Secondary | ICD-10-CM

## 2023-09-16 DIAGNOSIS — Z23 Encounter for immunization: Secondary | ICD-10-CM | POA: Diagnosis not present

## 2023-09-16 DIAGNOSIS — Z125 Encounter for screening for malignant neoplasm of prostate: Secondary | ICD-10-CM

## 2023-09-16 LAB — COMPREHENSIVE METABOLIC PANEL
ALT: 16 U/L (ref 0–53)
AST: 15 U/L (ref 0–37)
Albumin: 4.2 g/dL (ref 3.5–5.2)
Alkaline Phosphatase: 69 U/L (ref 39–117)
BUN: 18 mg/dL (ref 6–23)
CO2: 30 meq/L (ref 19–32)
Calcium: 9.1 mg/dL (ref 8.4–10.5)
Chloride: 107 meq/L (ref 96–112)
Creatinine, Ser: 1.2 mg/dL (ref 0.40–1.50)
GFR: 67.82 mL/min (ref >60.00)
Glucose, Bld: 99 mg/dL (ref 70–99)
Potassium: 4.6 meq/L (ref 3.5–5.1)
Sodium: 142 meq/L (ref 135–145)
Total Bilirubin: 0.5 mg/dL (ref 0.2–1.2)
Total Protein: 6.1 g/dL (ref 6.0–8.3)

## 2023-09-16 LAB — LIPID PANEL
Cholesterol: 193 mg/dL (ref 0–200)
HDL: 40.6 mg/dL (ref >39.00)
LDL Cholesterol: 132 mg/dL — ABNORMAL HIGH (ref 0–99)
NonHDL: 152.26
Total CHOL/HDL Ratio: 5
Triglycerides: 101 mg/dL (ref 0.0–149.0)
VLDL: 20.2 mg/dL (ref 0.0–40.0)

## 2023-09-16 LAB — CBC
HCT: 40.6 % (ref 39.0–52.0)
Hemoglobin: 13.4 g/dL (ref 13.0–17.0)
MCHC: 32.9 g/dL (ref 30.0–36.0)
MCV: 93.4 fL (ref 78.0–100.0)
Platelets: 334 10*3/uL (ref 150.0–400.0)
RBC: 4.35 Mil/uL (ref 4.22–5.81)
RDW: 14.5 % (ref 11.5–15.5)
WBC: 4.1 10*3/uL (ref 4.0–10.5)

## 2023-09-16 LAB — HEMOGLOBIN A1C: Hgb A1c MFr Bld: 6.4 % (ref 4.6–6.5)

## 2023-09-16 LAB — PSA: PSA: 0.36 ng/mL (ref 0.10–4.00)

## 2023-09-16 NOTE — Patient Instructions (Signed)
Give us 2-3 business days to get the results of your labs back.   Keep the diet clean and stay active.  Please get me a copy of your advanced directive form at your convenience.   The Shingrix vaccine (for shingles) is a 2 shot series spaced 2-6 months apart. It can make people feel low energy, achy and almost like they have the flu for 48 hours after injection. 1/5 people can have nausea and/or vomiting. Please plan accordingly when deciding on when to get this shot. Call our office for a nurse visit appointment to get this. The second shot of the series is less severe regarding the side effects, but it still lasts 48 hours.   Let us know if you need anything.  

## 2023-09-16 NOTE — Progress Notes (Signed)
Chief Complaint  Patient presents with   Annual Exam    Pt states fasting     Well Male [name] [name] is here for a complete physical.   His last physical was >1 year ago.  Current diet: in general, a "healthy" diet.  Current exercise: strength training, some cardio Weight trend: stable Fatigue out of ordinary? No. Seat belt? Yes.   Advanced directive? No  Health maintenance Shingrix- No Colonoscopy- Yes Tetanus- Yes HIV- Yes Hep C- Yes   Past Medical History:  Diagnosis Date   Bilateral inguinal hernia    Hyperlipidemia    Hypertension    Multiple sclerosis (HCC)    Nephrolithiasis    Neuromuscular disorder (HCC)    MS      Past Surgical History:  Procedure Laterality Date   HERNIA REPAIR  08/27/2012   bilateral repair with mesh   NASAL SEPTUM SURGERY  05/30/2018   NASAL SINUS SURGERY  04/03/2021   SQUAMOUS CELL CARCINOMA EXCISION     From the forehead   VASECTOMY      Medications  Current Outpatient Medications on File Prior to Visit  Medication Sig Dispense Refill   amLODipine-valsartan (EXFORGE) 5-320 MG tablet TAKE 1 TABLET BY MOUTH EVERY DAY 90 tablet 1   cetirizine (ZYRTEC) 10 MG tablet Take 1 tablet (10 mg total) by mouth daily. 30 tablet 11   Cholecalciferol (VITAMIN D PO) Take 5,000 Units by mouth daily.      Fingolimod HCl 0.5 MG CAPS TAKE 1 CAPSULE BY MOUTH 1 TIME A DAY 90 capsule 1   gabapentin (NEURONTIN) 300 MG capsule TAKE 1 CAPSULE BY MOUTH TWICE A DAY 180 capsule 1   Multiple Vitamin (MULTIVITAMIN WITH MINERALS) TABS Take 1 tablet by mouth daily.     vitamin B-12 (CYANOCOBALAMIN) 1000 MCG tablet Take 1,000 mcg by mouth daily.      Allergies Allergies  Allergen Reactions   Mold Extract [Trichophyton]     congestion   Penicillins Other (See Comments)    Reaction in childhood. Does not remember.    Family History Family History  Problem Relation Age of Onset   Hypertension Father    Colon polyps Father    Cancer Maternal  Grandfather        colon   Colon cancer Neg Hx    Esophageal cancer Neg Hx    Rectal cancer Neg Hx    Stomach cancer Neg Hx    Multiple sclerosis Neg Hx     Review of Systems: Constitutional:  no fevers Eye:  no recent significant change in vision Ear/Nose/Mouth/Throat:  Ears:  no hearing loss Nose/Mouth/Throat:  no complaints of nasal congestion, no sore throat Cardiovascular:  no chest pain Respiratory:  no shortness of breath Gastrointestinal:  no change in bowel habits GU:  Male: negative for dysuria, frequency Musculoskeletal/Extremities:  no joint pain Integumentary (Skin/Breast):  no abnormal skin lesions reported Neurologic:  no headaches Endocrine: No unexpected weight changes Hematologic/Lymphatic:  no abnormal bleeding  Exam BP 118/70 (BP Location: Left Arm, Patient Position: Sitting, Cuff Size: Normal)   Pulse 69   Temp 98.7 F (37.1 C) (Oral)   Resp 18   Ht 5\' 4"  (1.626 m)   Wt 147 lb (66.7 kg)   SpO2 99%   BMI 25.23 kg/m  General:  well developed, well nourished, in no apparent distress Skin:  no significant moles, warts, or growths Head:  no masses, lesions, or tenderness Eyes:  pupils equal and round, sclera anicteric  without injection Ears:  canals without lesions, TMs shiny without retraction, no obvious effusion, no erythema Nose:  nares patent, mucosa normal Throat/Pharynx:  lips and gingiva without lesion; tongue and uvula midline; non-inflamed pharynx; no exudates or postnasal drainage Neck: neck supple without adenopathy, thyromegaly, or masses Cardiac: RRR, no bruits, no LE edema Lungs:  clear to auscultation, breath sounds equal bilaterally, no respiratory distress Abdomen: BS+, soft, non-tender, non-distended, no masses or organomegaly noted Rectal: Deferred Musculoskeletal:  symmetrical muscle groups noted without atrophy or deformity Neuro:  gait normal; deep tendon reflexes normal and symmetric Psych: well oriented with normal range of  affect and appropriate judgment/insight  Assessment and Plan  Well adult exam - Plan: CBC, Comprehensive metabolic panel, Lipid panel  Prediabetes - Plan: Hemoglobin A1c  Screening for prostate cancer - Plan: PSA  Need for influenza vaccination - Plan: Flu vaccine trivalent PF, 6mos and older(Flulaval,Afluria,Fluarix,Fluzone)   Well 57 y.o. male. Counseled on diet and exercise. Counseled on risks and benefits of prostate cancer screening with PSA. The patient agrees to undergo testing. Advanced directive form provided today.  Shingrix rec'd. Flu shot today.  Immunizations, labs, and further orders as above. Follow up in 6 mo. The patient voiced understanding and agreement to the plan.  Jilda Roche Jenera, DO 09/16/23 8:44 AM

## 2023-09-27 ENCOUNTER — Telehealth: Payer: Self-pay | Admitting: Neurology

## 2023-09-27 NOTE — Telephone Encounter (Signed)
 [facility]/ Janna Request refill for a 90-day supply for Fingolimod HCl 0.5 MG CAPS.

## 2023-09-30 MED ORDER — FINGOLIMOD HCL 0.5 MG PO CAPS: ORAL_CAPSULE | ORAL | 1 refills | Status: DC

## 2023-09-30 NOTE — Telephone Encounter (Signed)
 Pt is established with Dr Terrace Arabia and Tylene Fantasia for MS medication management. I have forwarded the script for the patient for refill to [facility]

## 2023-10-01 MED ORDER — FINGOLIMOD HCL 0.5 MG PO CAPS: ORAL_CAPSULE | ORAL | 1 refills | Status: DC

## 2023-10-01 NOTE — Telephone Encounter (Signed)
 Dois Davenport with [facility] states they have not received the refill. Fax to [phone]. Can call [phone]

## 2023-10-01 NOTE — Addendum Note (Signed)
 Addended by: Judi Cong on: 10/01/2023 10:34 AM   Modules accepted: Orders

## 2023-10-01 NOTE — Telephone Encounter (Signed)
 I have resent to the specific fax number provided and appears a PA may be needed. I will forward this to our PA team to work on as well

## 2023-10-02 ENCOUNTER — Other Ambulatory Visit (HOSPITAL_COMMUNITY): Payer: Self-pay

## 2023-10-02 ENCOUNTER — Telehealth: Payer: Self-pay

## 2023-10-02 NOTE — Telephone Encounter (Signed)
 Pharmacy Patient Advocate Encounter   Received notification from Physician's Office that prior authorization for Fingolimod  HCl 0.5MG  capsules is required/requested.   Insurance verification completed.   The patient is insured through [facility] .   Per test claim: PA required; PA started via CoverMyMeds. KEY [id number] . Waiting for clinical questions to populate.

## 2023-10-02 NOTE — Telephone Encounter (Signed)
   I could not find documentation in the chart that states which type of MS this patient has-please advise-Thanks.

## 2023-10-02 NOTE — Telephone Encounter (Signed)
 RRMS I will put in my last note

## 2023-10-02 NOTE — Telephone Encounter (Signed)
Clinical questions have been submitted-awaiting determination. 

## 2023-10-02 NOTE — Telephone Encounter (Signed)
 PA request has been Submitted. New Encounter created for follow up. For additional info see Pharmacy Prior Auth telephone encounter from 10/02/2023.

## 2023-10-02 NOTE — Telephone Encounter (Signed)
Perfect! Thanks so much!

## 2023-10-03 ENCOUNTER — Other Ambulatory Visit (HOSPITAL_COMMUNITY): Payer: Self-pay

## 2023-10-04 ENCOUNTER — Other Ambulatory Visit (HOSPITAL_COMMUNITY): Payer: Self-pay

## 2023-10-04 NOTE — Telephone Encounter (Signed)
 Patient has been notified of approval

## 2023-10-04 NOTE — Telephone Encounter (Signed)
 Pharmacy Patient Advocate Encounter  Received notification from [facility] that Prior Authorization for Fingolimod HCl 0.5MG  capsules has been APPROVED from 10/02/2023 to 09/30/2024   PA #/Case ID/Reference #: PA Case ID #: [id number]

## 2023-12-28 ENCOUNTER — Encounter: Payer: Self-pay | Admitting: Adult Health

## 2023-12-28 ENCOUNTER — Other Ambulatory Visit: Payer: Self-pay | Admitting: Adult Health

## 2023-12-30 ENCOUNTER — Other Ambulatory Visit: Payer: Self-pay | Admitting: Family Medicine

## 2023-12-30 ENCOUNTER — Encounter: Payer: Self-pay | Admitting: Neurology

## 2023-12-30 DIAGNOSIS — I10 Essential (primary) hypertension: Secondary | ICD-10-CM

## 2023-12-30 NOTE — Telephone Encounter (Signed)
 Gabapentin refilled through refill queue. Per Aundra Millet NP's last note in October 2024, continue Gabapentin 300 mg BID.

## 2024-01-06 DIAGNOSIS — L570 Actinic keratosis: Secondary | ICD-10-CM | POA: Diagnosis not present

## 2024-01-06 DIAGNOSIS — C4442 Squamous cell carcinoma of skin of scalp and neck: Secondary | ICD-10-CM | POA: Diagnosis not present

## 2024-01-20 ENCOUNTER — Ambulatory Visit: Payer: BC Managed Care – PPO | Admitting: Neurology

## 2024-02-18 DIAGNOSIS — C4442 Squamous cell carcinoma of skin of scalp and neck: Secondary | ICD-10-CM | POA: Diagnosis not present

## 2024-03-16 ENCOUNTER — Ambulatory Visit: Payer: Self-pay | Admitting: Family Medicine

## 2024-03-16 ENCOUNTER — Encounter: Payer: Self-pay | Admitting: Family Medicine

## 2024-03-16 ENCOUNTER — Ambulatory Visit: Payer: BC Managed Care – PPO | Admitting: Family Medicine

## 2024-03-16 VITALS — BP 130/78 | HR 97 | Temp 98.0°F | Resp 16 | Ht 64.0 in | Wt 137.0 lb

## 2024-03-16 DIAGNOSIS — I10 Essential (primary) hypertension: Secondary | ICD-10-CM | POA: Diagnosis not present

## 2024-03-16 DIAGNOSIS — R7303 Prediabetes: Secondary | ICD-10-CM | POA: Diagnosis not present

## 2024-03-16 DIAGNOSIS — J301 Allergic rhinitis due to pollen: Secondary | ICD-10-CM

## 2024-03-16 DIAGNOSIS — R002 Palpitations: Secondary | ICD-10-CM

## 2024-03-16 LAB — COMPREHENSIVE METABOLIC PANEL WITH GFR
ALT: 18 U/L (ref 0–53)
AST: 19 U/L (ref 0–37)
Albumin: 4.4 g/dL (ref 3.5–5.2)
Alkaline Phosphatase: 69 U/L (ref 39–117)
BUN: 22 mg/dL (ref 6–23)
CO2: 28 meq/L (ref 19–32)
Calcium: 9.3 mg/dL (ref 8.4–10.5)
Chloride: 105 meq/L (ref 96–112)
Creatinine, Ser: 1.33 mg/dL (ref 0.40–1.50)
GFR: 59.74 mL/min — ABNORMAL LOW (ref >60.00)
Glucose, Bld: 96 mg/dL (ref 70–99)
Potassium: 5 meq/L (ref 3.5–5.1)
Sodium: 142 meq/L (ref 135–145)
Total Bilirubin: 0.6 mg/dL (ref 0.2–1.2)
Total Protein: 6.2 g/dL (ref 6.0–8.3)

## 2024-03-16 LAB — LIPID PANEL
Cholesterol: 168 mg/dL (ref 0–200)
HDL: 38.8 mg/dL — ABNORMAL LOW (ref >39.00)
LDL Cholesterol: 113 mg/dL — ABNORMAL HIGH (ref 0–99)
NonHDL: 128.87
Total CHOL/HDL Ratio: 4
Triglycerides: 79 mg/dL (ref 0.0–149.0)
VLDL: 15.8 mg/dL (ref 0.0–40.0)

## 2024-03-16 LAB — HEMOGLOBIN A1C: Hgb A1c MFr Bld: 6.2 % (ref 4.6–6.5)

## 2024-03-16 LAB — TSH: TSH: 0.54 u[IU]/mL (ref 0.35–5.50)

## 2024-03-16 LAB — MAGNESIUM: Magnesium: 2.2 mg/dL (ref 1.5–2.5)

## 2024-03-16 MED ORDER — MONTELUKAST SODIUM 10 MG PO TABS: 10.0000 mg | ORAL_TABLET | Freq: Every day | ORAL | 1 refills | Status: DC

## 2024-03-16 MED ORDER — PREDNISONE 20 MG PO TABS: 40.0000 mg | ORAL_TABLET | Freq: Every day | ORAL | 0 refills | Status: AC

## 2024-03-16 NOTE — Patient Instructions (Addendum)
 Give us  2-3 business days to get the results of your labs back.   Keep the diet clean and stay active.  Try to drink 55-60 oz of water daily outside of exercise.  Pepcid/famotidine 20 mg 1-2 times daily can help with reflux symptoms.   Let us  know if you need anything.

## 2024-03-16 NOTE — Progress Notes (Signed)
 Chief Complaint  Patient presents with   Medication Refill    Medication Check    Subjective [name] [name] is a 58 y.o. male who presents for hypertension follow up. He does not monitor home blood pressures. He is compliant with medications-Exforge  5-320 mg daily. Patient has these side effects of medication: none He is normally adhering to a healthy diet overall. Current exercise: limited, some golf No CP or SOB.   Over the last 1 to 2 months he has been having fluttering in his chest every several days.  Last for few seconds.  No new shortness of breath, chest pain, or lightheadedness.  He does not always stay well-hydrated.  He has decreased his caffeine intake.  He does not drink alcohol routinely.  He wonders if reflux could be playing a role.  He does not take anything for this but notices it and will sometimes take place after specific meals that trigger reflux.   Past Medical History:  Diagnosis Date   Bilateral inguinal hernia    Hyperlipidemia    Hypertension    Multiple sclerosis (HCC)    Nephrolithiasis    Neuromuscular disorder (HCC)    MS    Exam BP 130/78 (BP Location: Left Arm, Patient Position: Sitting)   Pulse 97   Temp 98 F (36.7 C) (Oral)   Resp 16   Ht 5' 4 (1.626 m)   Wt 137 lb (62.1 kg)   SpO2 96%   BMI 23.52 kg/m  General:  well developed, well nourished, in no apparent distress Heart: RRR, no bruits, no LE edema Lungs: clear to auscultation, no accessory muscle use Psych: well oriented with normal range of affect and appropriate judgment/insight  Essential hypertension - Plan: Comprehensive metabolic panel with GFR, Lipid panel  Prediabetes - Plan: Hemoglobin A1c  Palpitations - Plan: TSH, Magnesium  Seasonal allergic rhinitis due to pollen - Plan: montelukast  (SINGULAIR ) 10 MG tablet  Chronic, stable.  Continue Exforge  5-320 mg daily.Counseled on diet and exercise. Discussed medication to lower A1c and formal dx of DM2. Will see what  numbers are today.  Check above labs.  Unremarkable heart rate/rhythm today.  We discussed the utility of an EKG versus long-term monitoring.  He will trial hydration and famotidine as needed first.  If still having issues, he will send me a message and we will order a 2-week Zio patch. Chronic, not controlled.  5-day prednisone burst 40 mg daily for acute treatment.  Continue Zyrtec  10 mg daily.  He has failed nasal steroids.  Will add montelukast  back at 10 mg daily as needed. F/u in 6 months. The patient voiced understanding and agreement to the plan.  Mabel [place], DO 03/16/24  7:50 AM

## 2024-04-02 ENCOUNTER — Other Ambulatory Visit: Payer: Self-pay | Admitting: *Deleted

## 2024-04-02 MED ORDER — GABAPENTIN 300 MG PO CAPS: 300.0000 mg | ORAL_CAPSULE | Freq: Two times a day (BID) | ORAL | 0 refills | Status: DC

## 2024-04-03 ENCOUNTER — Other Ambulatory Visit: Payer: Self-pay | Admitting: Family Medicine

## 2024-04-03 DIAGNOSIS — I10 Essential (primary) hypertension: Secondary | ICD-10-CM

## 2024-04-15 IMAGING — CT CT CARDIAC CORONARY ARTERY CALCIUM SCORE
2 series · 15 of 20 positions shown, 17 images · non-contrast
Comparison: None Available.
COMPARISON: None Available.

Addendum:
CLINICAL DATA: Coronary artery disease screening, low risk CAD,
history hypertension, hyperlipidemia, multiple sclerosis, kidney
stones
CLINICAL DATA: Cardiovascular Disease Risk stratification

EXAM:
Coronary Calcium Score
TECHNIQUE: A gated, non-contrast computed tomography scan of the heart was
performed using 3mm slice thickness. Axial images were analyzed on a
dedicated workstation. Calcium scoring of the coronary arteries was
performed using the Agatston method.

[Series 2: cascseq 3.0 b35f (id) · axial · 0.39mm/px · z∈[-231,-126]mm · 8 of 45 slices shown, 10 images]
[im 5/45  vessel]
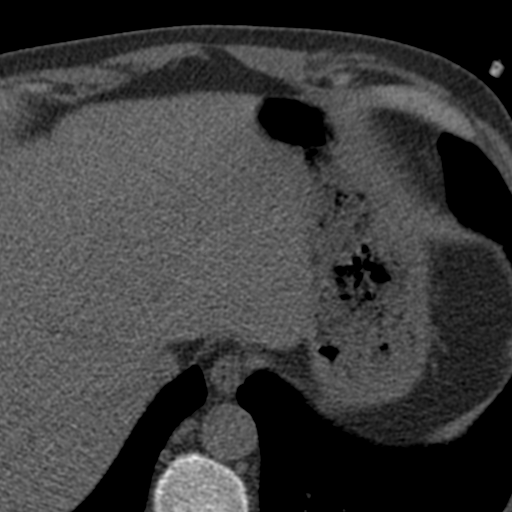
[im 5/45  lung]
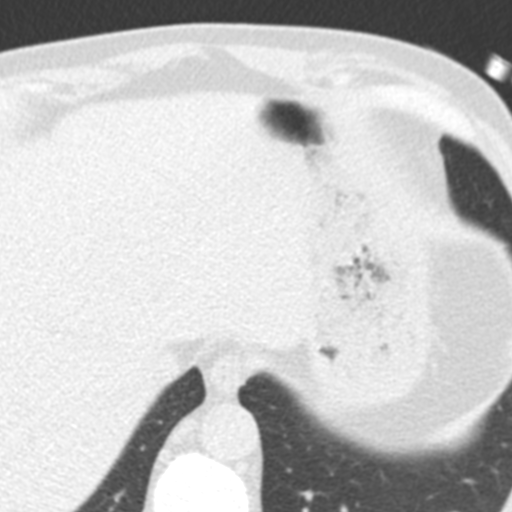
[im 10/45  vessel]
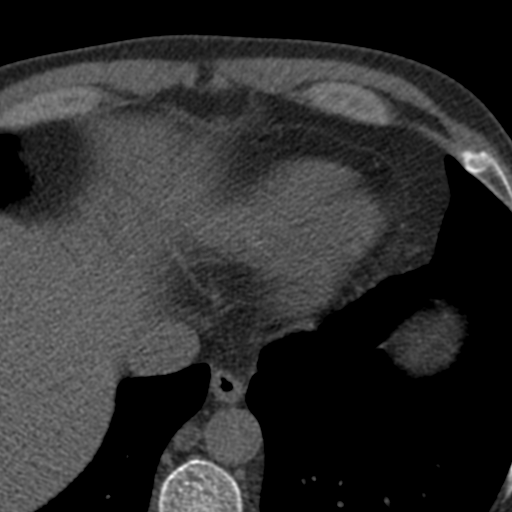
[im 15/45  vessel]
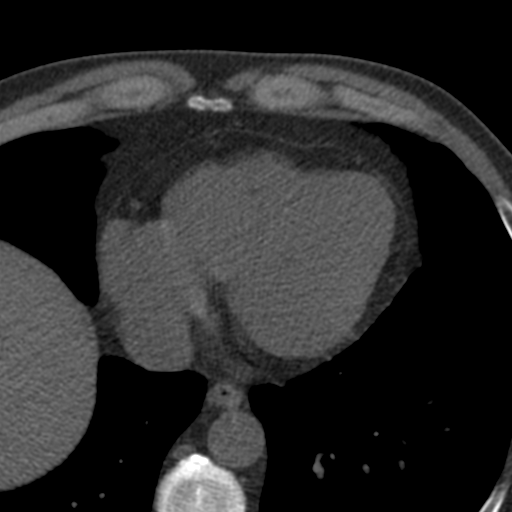
[im 20/45  vessel]
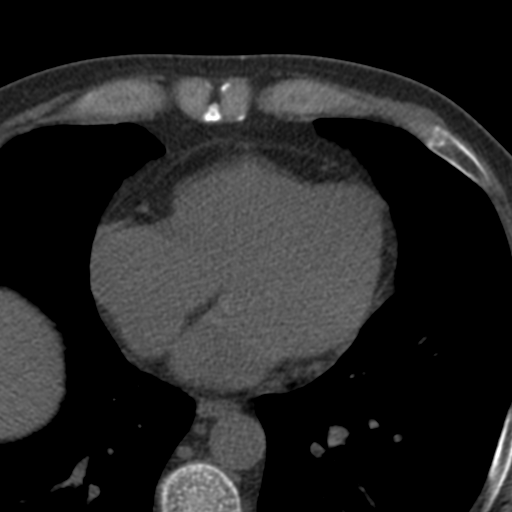
[im 25/45  vessel]
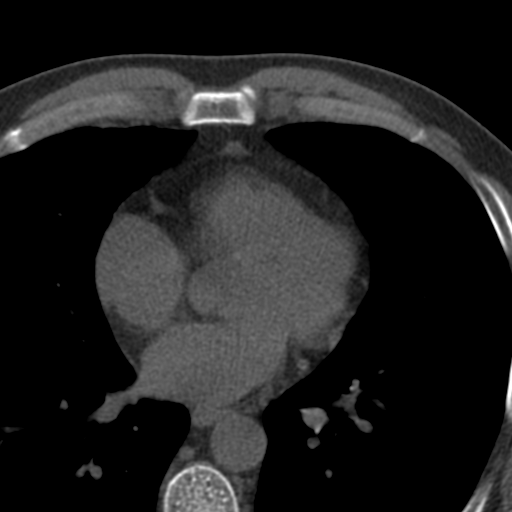
[im 25/45  lung]
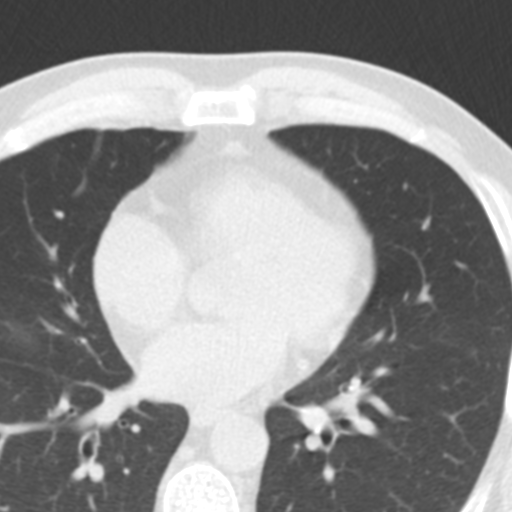
[im 30/45  vessel]
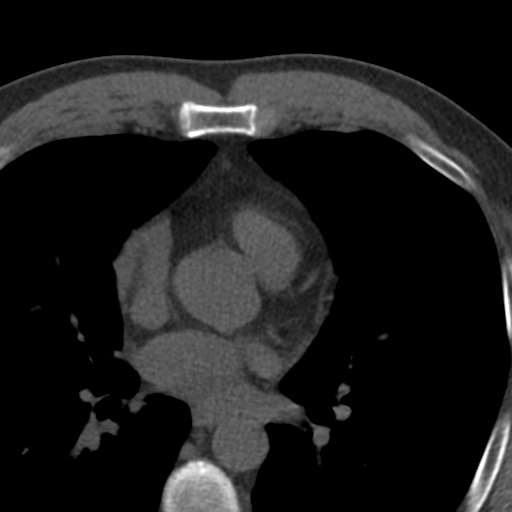
[im 35/45  vessel]
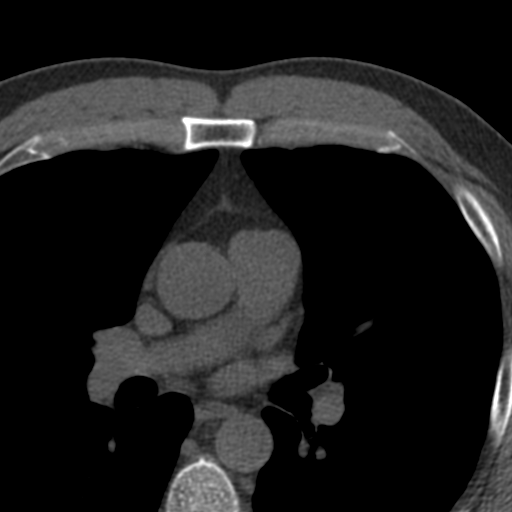
[im 40/45  vessel]
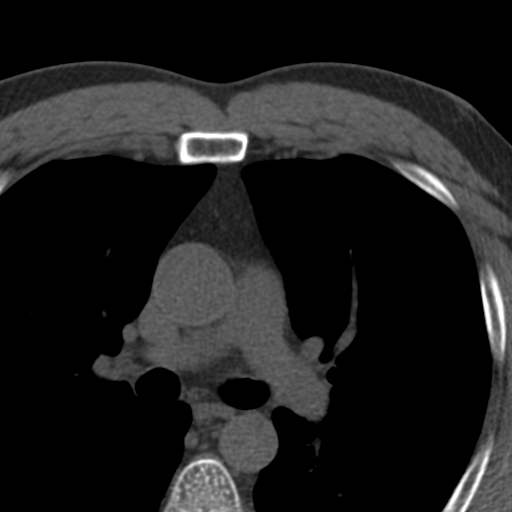

[Series 3: full fov st · axial · 0.67mm/px · z∈[-231,-141]mm · 7 of 45 slices shown]
[im 5/45  vessel]
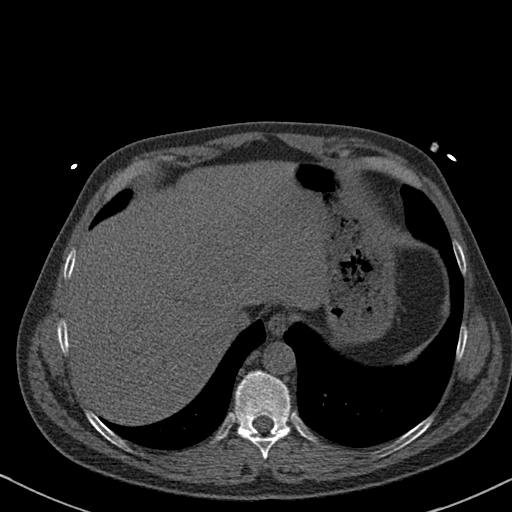
[im 10/45  vessel]
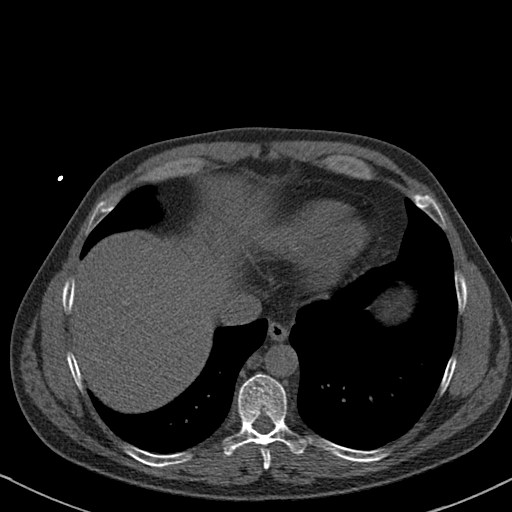
[im 15/45  vessel]
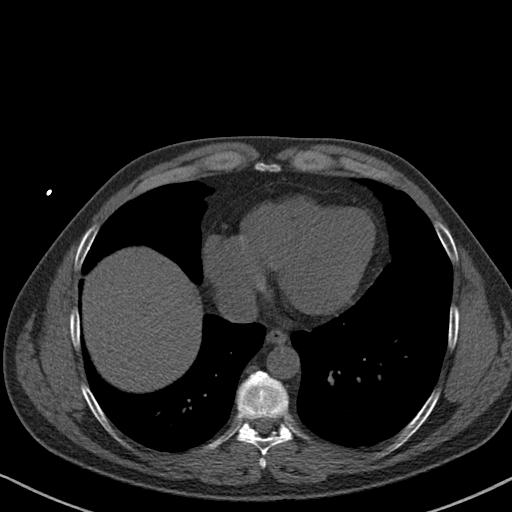
[im 20/45  vessel]
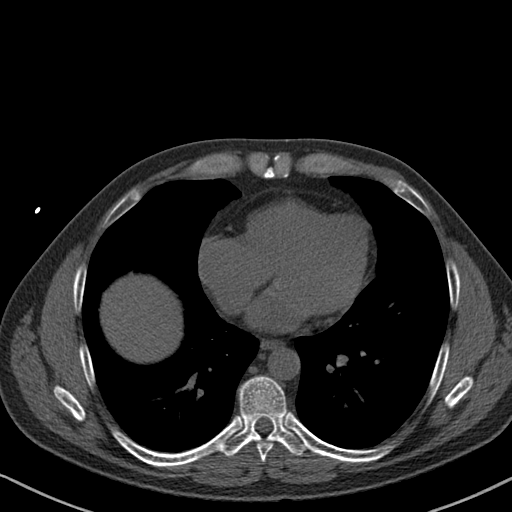
[im 25/45  vessel]
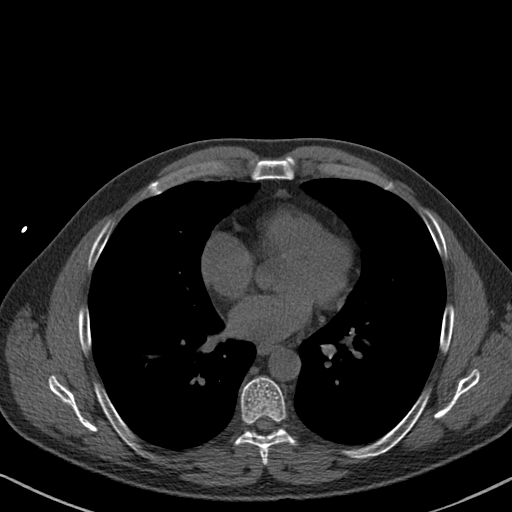
[im 30/45  vessel]
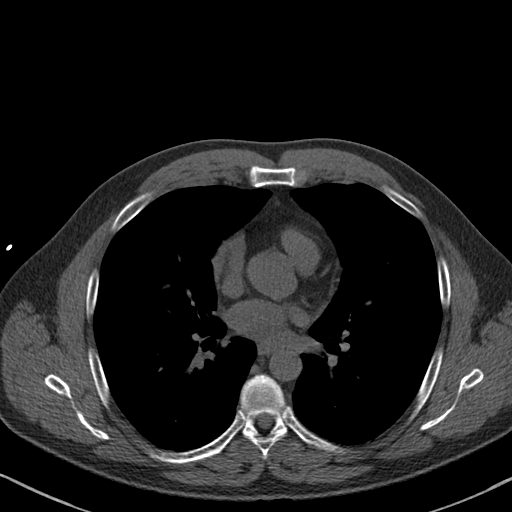
[im 35/45  vessel]
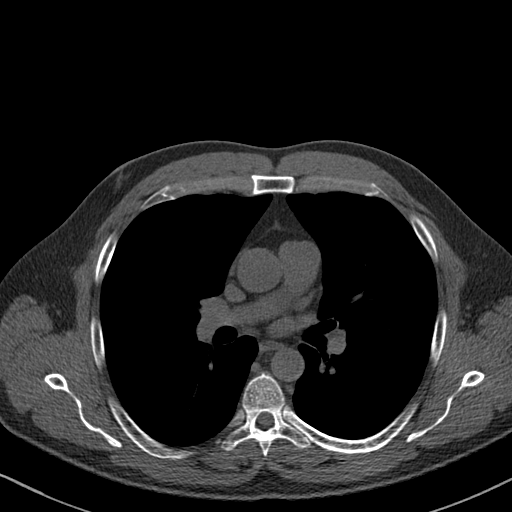

[15 of 20 positions shown; findings below may reference images not displayed]

EXAM:
OVER-READ INTERPRETATION  CT CHEST

The following report is a limited chest CT over-read performed by
radiologist Dr. Anthony Traffic Shakes [REDACTED] on 03/15/2022.
This over-read does not include interpretation of cardiac or
coronary anatomy or pathology. The coronary calcium score
interpretation by the cardiologist is attached.
FINDINGS: Vascular: Aorta normal caliber.  No pericardial effusion.

Mediastinum/Nodes: Visualized esophagus unremarkable. No adenopathy.

Lungs/Pleura: 3 mm RIGHT lower lobe nodule image 28; no follow-up
imaging recommended. Visualized lungs otherwise clear.

Upper Abdomen: Unremarkable

Musculoskeletal: No acute osseous findings.
IMPRESSION: No significant noncardiac abnormalities, as above.
FINDINGS: Coronary arteries: Normal origins.

Coronary Calcium Score:

Left main: 0

Left anterior descending artery: 0

Left circumflex artery: 0

Right coronary artery: 0

Total: 0

Percentile: 0

Pericardium: Normal.

Aorta: Normal caliber of ascending aorta. No aortic atherosclerosis
noted.

Non-cardiac: See separate report from [REDACTED].
IMPRESSION: Coronary calcium score of 0. This was 0 percentile for age-, race-,
and sex-matched controls.



If CAC=0, it is reasonable to withhold statin therapy and reassess
in 5 to 10 years, as long as higher risk conditions are absent
(diabetes mellitus, family history of premature CHD in first degree
relatives (males <55 years; females <65 years), cigarette smoking,
or LDL >=190 mg/dL).

If CAC is 1 to 99, it is reasonable to initiate statin therapy for
patients >=55 years of age.

If CAC is >=100 or >=75th percentile, it is reasonable to initiate
statin therapy at any age.

Cardiology referral should be considered for patients with CAC
scores >=400 or >=75th percentile.

*9402 AHA/ACC/AACVPR/AAPA/ABC/MOUAZ/CARMELOTAPIA/ITACHII/Luk/NIC/KEMI/TIGER
Guideline on the Management of Blood Cholesterol: A Report of the
American College of Cardiology/American Heart Association Task Force
on Clinical Practice Guidelines. J Am Coll Cardiol.
7796;73(24):3052-3427.

*** End of Addendum ***
EXAM:
OVER-READ INTERPRETATION  CT CHEST

The following report is a limited chest CT over-read performed by
radiologist Dr. Anthony Traffic Shakes [REDACTED] on 03/15/2022.
This over-read does not include interpretation of cardiac or
coronary anatomy or pathology. The coronary calcium score
interpretation by the cardiologist is attached.
FINDINGS: Vascular: Aorta normal caliber.  No pericardial effusion.

Mediastinum/Nodes: Visualized esophagus unremarkable. No adenopathy.

Lungs/Pleura: 3 mm RIGHT lower lobe nodule image 28; no follow-up
imaging recommended. Visualized lungs otherwise clear.

Upper Abdomen: Unremarkable

Musculoskeletal: No acute osseous findings.
IMPRESSION: No significant noncardiac abnormalities, as above.

## 2024-04-28 ENCOUNTER — Other Ambulatory Visit: Payer: Self-pay | Admitting: Diagnostic Neuroimaging

## 2024-04-30 ENCOUNTER — Other Ambulatory Visit: Payer: Self-pay | Admitting: Adult Health

## 2024-04-30 NOTE — Telephone Encounter (Signed)
 Spoke to patient made a f/u appointment 04/2024 Gave patient 30 day refill to last him until his appointment . Pt expressed understanding and thanked  me for calling

## 2024-05-05 ENCOUNTER — Ambulatory Visit: Admitting: Adult Health

## 2024-05-05 VITALS — Wt 136.4 lb

## 2024-05-05 DIAGNOSIS — L570 Actinic keratosis: Secondary | ICD-10-CM | POA: Diagnosis not present

## 2024-05-05 DIAGNOSIS — Z5181 Encounter for therapeutic drug level monitoring: Secondary | ICD-10-CM | POA: Diagnosis not present

## 2024-05-05 DIAGNOSIS — D225 Melanocytic nevi of trunk: Secondary | ICD-10-CM | POA: Diagnosis not present

## 2024-05-05 DIAGNOSIS — G35 Multiple sclerosis: Secondary | ICD-10-CM

## 2024-05-05 DIAGNOSIS — D2272 Melanocytic nevi of left lower limb, including hip: Secondary | ICD-10-CM | POA: Diagnosis not present

## 2024-05-05 DIAGNOSIS — Z85828 Personal history of other malignant neoplasm of skin: Secondary | ICD-10-CM | POA: Diagnosis not present

## 2024-05-05 DIAGNOSIS — L821 Other seborrheic keratosis: Secondary | ICD-10-CM | POA: Diagnosis not present

## 2024-05-05 MED ORDER — FINGOLIMOD HCL 0.5 MG PO CAPS: ORAL_CAPSULE | ORAL | 3 refills | Status: AC

## 2024-05-05 MED ORDER — GABAPENTIN 300 MG PO CAPS: 300.0000 mg | ORAL_CAPSULE | Freq: Two times a day (BID) | ORAL | 3 refills | Status: AC

## 2024-05-05 NOTE — Progress Notes (Addendum)
 PATIENT: [name] [name] DOB: Feb 10, 1966  REASON FOR VISIT: follow up HISTORY FROM: patient Primary Neurologist: Dr. Margaret   Chief Complaint  Patient presents with   Multiple Sclerosis    Room 20, self, no concerns      HISTORY OF PRESENT ILLNESS: Today 05/05/24:  [name] [name] is a 58 y.o. male with a history of relapsed remitting multiple sclerosis. Returns today for follow-up.  He remains on Gilenya .  Overall doing relatively well.  No changes in his gait or balance.  No changes with his vision.  No changes with the bowels or bladder.  Reports numbness/stiffness in the hands particularly in the mornings. this has been there since he was initially diagnosed with MS.  He does use gabapentin .  At the last visit he was referred for sleep evaluation.  A home sleep test was ordered but not yet completed.  He reports that his sleep has improved so he has held off on sleep test.  Patient had MRI of the brain and cervical spine in 2024 which was stable with no new lesions.   07/22/23: [name] [name] is a 58 y.o. male with a history of relapse remitting Multiple sclerosis. Returns today for follow-up. Remains on Gilenya . No changes with bowels or bladder. Reports some changes with vision- blurry at times- opthalmology is changing prescription. No change in gait or balance.  Reports increased numbness in hands-All fingers. Numbness was  there when he was initially diagnosed with MS.  Reports Cramps in the bottom on the feet. alternates feets. May happened a few days in a row then will go weeks. Reports that he doesn't sleep well. Will fall asleep easily but then wake up and can't go back to sleep. He will just stay up the rest of the night. Reports that his wife states that he snores very loudly. Reports two nasal surgeries for deviated septum and for polyps. Returns today for follow-up.    12/18/22: [name] [name] is a 58 y.o. male with a history of Multiple Sclerosis. Returns today for  follow-up.    He remains on Gilenya .  States that it continues to work well for him.  Denies any new symptoms.  No change in his bowels or bladder.  No new  weakness.  No change in his gait or balance.  Reports increased numbness in the upper extremities-reports that the numbness is primarily isolated to the fingertips.  He states occasionally when he wakes up during the night both arms will feel slightly numb.  Remains on gabapentin  300 mg twice a day.  Went to see opthamalogy- reports testing was good.   12/07/20: [name] [name] is a 69-yea month old male with a history of multiple sclerosis.  He returns today for follow-up.  Overall he feels that he has remained stable.  Continues on Gilenya .  Denies any changes with the bowels or bladder.  No new changes with his gait or balance.  No new numbness or weakness.  Continues on gabapentin  300 mg twice a day  12/07/20: [name] [name] is a 58 year old male with a history of multiple sclerosis.  He returns today for follow-up.  He remains on Gilenya .  He denies any new numbness or weakness.  Denies any changes with his gait or balance.  No changes with his vision.  No changes with the bowels or bladder.  Reports that he continues to take gabapentin -reports that he usually takes it twice a day instead of 3 times a day.  He  returns today for an evaluation.  12/08/19: [name] [name] is a 58 year old male with a history of multiple sclerosis.  He returns today for follow-up.  He continues on Gilenya .  He reports that he does have ongoing numbness in the hands.  He feels that over the last year may have gotten worse.  The last 2 digits he feels is worse regarding the numbness.  Especially when he wakes up in the morning.  He denies any changes with his vision.  No changes with the bowels or bladder.  No new weakness.  No significant changes in his gait or balance.  He returns today for an evaluation.  HISTORY 09/11/18 [name] [name] is a 58 year old male with a history of  multiple sclerosis.  He returns today for follow-up.  He remains on Gilenya  and is tolerating it well.  He was scheduled to have MRI of the brain and cervical spine at the last visit he reports that he forgot to call [facility] imaging.  He has not completed the scans yet.  He denies any new numbness or weakness.  No change in his gait or balance.  No change in his vision.  No change in the bowels or bladder.  He returns today for evaluation.  REVIEW OF SYSTEMS: Out of a complete 14 system review of symptoms, the patient complains only of the following symptoms, and all other reviewed systems are negative.  See HPI  ALLERGIES: Allergies  Allergen Reactions   Mold Extract [Trichophyton]     congestion   Penicillins Other (See Comments)    Reaction in childhood. Does not remember.    HOME MEDICATIONS: Outpatient Medications Prior to Visit  Medication Sig Dispense Refill   amLODipine -valsartan  (EXFORGE ) 5-320 MG tablet Take 1 tablet by mouth daily. 90 tablet 1   cetirizine  (ZYRTEC ) 10 MG tablet Take 1 tablet (10 mg total) by mouth daily. 30 tablet 11   Cholecalciferol (VITAMIN D PO) Take 5,000 Units by mouth daily.      Fingolimod  HCl 0.5 MG CAPS TAKE 1 CAPSULE BY MOUTH 1 TIME A DAY 90 capsule 1   gabapentin  (NEURONTIN ) 300 MG capsule Take 1 capsule (300 mg total) by mouth 2 (two) times daily. Appointment needed for further refills. Call [phone]. 60 capsule 0   montelukast  (SINGULAIR ) 10 MG tablet Take 1 tablet (10 mg total) by mouth at bedtime. 90 tablet 1   Multiple Vitamin (MULTIVITAMIN WITH MINERALS) TABS Take 1 tablet by mouth daily.     vitamin B-12 (CYANOCOBALAMIN) 1000 MCG tablet Take 1,000 mcg by mouth daily.     Facility-Administered Medications Prior to Visit  Medication Dose Route Frequency Provider Last Rate Last Admin   gadopentetate dimeglumine  (MAGNEVIST ) injection 14 mL  14 mL Intravenous Once PRN Jenel Carlin POUR, MD        PAST MEDICAL HISTORY: Past Medical  History:  Diagnosis Date   Bilateral inguinal hernia    Hyperlipidemia    Hypertension    Multiple sclerosis (HCC)    Nephrolithiasis    Neuromuscular disorder [facility])    MS    PAST SURGICAL HISTORY: Past Surgical History:  Procedure Laterality Date   HERNIA REPAIR  08/27/2012   bilateral repair with mesh   NASAL SEPTUM SURGERY  05/30/2018   NASAL SINUS SURGERY  04/03/2021   SQUAMOUS CELL CARCINOMA EXCISION     From the forehead   VASECTOMY      FAMILY HISTORY: Family History  Problem Relation Age of Onset   Hypertension Father  Colon polyps Father    Cancer Maternal Grandfather        colon   Colon cancer Neg Hx    Esophageal cancer Neg Hx    Rectal cancer Neg Hx    Stomach cancer Neg Hx    Multiple sclerosis Neg Hx     SOCIAL HISTORY: Social History   Socioeconomic History   Marital status: Married    Spouse name: Not on file   Number of children: 3   Years of education: College   Highest education level: Not on file  Occupational History    Employer: [employer]  Tobacco Use   Smoking status: Never   Smokeless tobacco: Never  Vaping Use   Vaping status: Never Used  Substance and Sexual Activity   Alcohol use: Yes    Alcohol/week: 0.0 standard drinks of alcohol    Comment: OCC   Drug use: No   Sexual activity: Yes    Partners: Female  Other Topics Concern   Not on file  Social History Narrative   Patient lives at home with his wife Giles) and two daughters.   Patient works full time.   Education college.   Right handed.   Caffeine three sodas daily.   Social Drivers of Corporate investment banker Strain: Not on file  Food Insecurity: Not on file  Transportation Needs: Not on file  Physical Activity: Not on file  Stress: Not on file  Social Connections: Not on file  Intimate Partner Violence: Not on file      PHYSICAL EXAM  Vitals:   05/05/24 1121  Weight: 136 lb 6.4 oz (61.9 kg)    Body mass index is 23.41 kg/m.  Generalized:  Well developed, in no acute distress   Neurological examination  Mentation: Alert oriented to time, place, history taking. Follows all commands speech and language fluent Cranial nerve II-XII: Pupils were equal round reactive to light. Extraocular movements were full, visual field were full on confrontational test.  Head turning and shoulder shrug  were normal and symmetric. Motor: The motor testing reveals 5 over 5 strength of all 4 extremities. Good symmetric motor tone is noted throughout.  Sensory: Sensory testing is intact to soft touch on all 4 extremities. No evidence of extinction is noted.  Coordination: Cerebellar testing reveals good finger-nose-finger and heel-to-shin bilaterally.  Gait and station: Gait is normal.   Reflexes: Deep tendon reflexes are symmetric and normal bilaterally.   DIAGNOSTIC DATA (LABS, IMAGING, TESTING) - I reviewed patient records, labs, notes, testing and imaging myself where available.  Lab Results  Component Value Date   WBC 4.1 09/16/2023   HGB 13.4 09/16/2023   HCT 40.6 09/16/2023   MCV 93.4 09/16/2023   PLT 334.0 09/16/2023      Component Value Date/Time   NA 142 03/16/2024 0754   NA 141 06/13/2021 0852   K 5.0 03/16/2024 0754   CL 105 03/16/2024 0754   CO2 28 03/16/2024 0754   GLUCOSE 96 03/16/2024 0754   BUN 22 03/16/2024 0754   BUN 14 06/13/2021 0852   CREATININE 1.33 03/16/2024 0754   CALCIUM 9.3 03/16/2024 0754   PROT 6.2 03/16/2024 0754   PROT 6.3 06/13/2021 0852   ALBUMIN 4.4 03/16/2024 0754   ALBUMIN 4.3 06/13/2021 0852   AST 19 03/16/2024 0754   ALT 18 03/16/2024 0754   ALKPHOS 69 03/16/2024 0754   BILITOT 0.6 03/16/2024 0754   BILITOT 0.3 06/13/2021 0852   GFRNONAA 64 12/08/2019 0841  GFRAA 74 12/08/2019 0841   Lab Results  Component Value Date   CHOL 168 03/16/2024   HDL 38.80 (L) 03/16/2024   LDLCALC 113 (H) 03/16/2024   LDLDIRECT 140.0 09/10/2022   TRIG 79.0 03/16/2024   CHOLHDL 4 03/16/2024   Lab Results   Component Value Date   HGBA1C 6.2 03/16/2024     ASSESSMENT AND PLAN 58 y.o. year old male  has a past medical history of Bilateral inguinal hernia, Hyperlipidemia, Hypertension, Multiple sclerosis (HCC), Nephrolithiasis, and Neuromuscular disorder (HCC). here with :  1.  Relapse Remitting Multiple sclerosis 2.  Numbness and tingling in the upper extremities    -Continue Gilenya  -Recently had blood work through his PCP but a CBC was not checked.  I will check that today. -Continue gabapentin  300 mg 2 times a day -We did discuss perhaps his numbness and tingling in his hand could be an overlap with carpal tunnel however he states that he has less numbness and more stiffness in the hands.  Will continue to just monitor for now -Advised that if symptoms worsen or he develops new symptoms he should let us  know - Follow-up in 1 year or sooner if needed   Duwaine Russell, MSN, NP-C 05/05/2024, 11:31 AM [facility] [address] [place], [place] [address] [phone]  The patient's condition requires frequent monitoring and adjustments in the treatment plan, reflecting the ongoing complexity of care.  This provider is the continuing focal point for all needed services for this condition.

## 2024-05-05 NOTE — Patient Instructions (Signed)
 Your Plan:  Continue Gilenya  Continue gabapentin  300 mg twice a day Will check blood work today     Thank you for coming to see us  at [facility]. I hope we have been able to provide you high quality care today.  You may receive a patient satisfaction survey over the next few weeks. We would appreciate your feedback and comments so that we may continue to improve ourselves and the health of our patients.

## 2024-05-06 LAB — CBC WITH DIFFERENTIAL/PLATELET
Basophils Absolute: 0.1 x10E3/uL (ref 0.0–0.2)
Basos: 1 %
EOS (ABSOLUTE): 0.9 x10E3/uL — ABNORMAL HIGH (ref 0.0–0.4)
Eos: 15 %
Hematocrit: 43.5 % (ref 37.5–51.0)
Hemoglobin: 14.1 g/dL (ref 13.0–17.7)
Immature Grans (Abs): 0 x10E3/uL (ref 0.0–0.1)
Immature Granulocytes: 0 %
Lymphocytes Absolute: 1.4 x10E3/uL (ref 0.7–3.1)
Lymphs: 24 %
MCH: 29.7 pg (ref 26.6–33.0)
MCHC: 32.4 g/dL (ref 31.5–35.7)
MCV: 92 fL (ref 79–97)
Monocytes Absolute: 0.8 x10E3/uL (ref 0.1–0.9)
Monocytes: 14 %
Neutrophils Absolute: 2.6 x10E3/uL (ref 1.4–7.0)
Neutrophils: 45 %
Platelets: 258 x10E3/uL (ref 150–450)
RBC: 4.74 x10E6/uL (ref 4.14–5.80)
RDW: 12.9 % (ref 11.6–15.4)
WBC: 5.7 x10E3/uL (ref 3.4–10.8)

## 2024-05-07 ENCOUNTER — Ambulatory Visit: Payer: Self-pay | Admitting: Adult Health

## 2024-06-08 ENCOUNTER — Other Ambulatory Visit: Payer: Self-pay | Admitting: Family Medicine

## 2024-09-02 ENCOUNTER — Other Ambulatory Visit (HOSPITAL_COMMUNITY): Payer: Self-pay

## 2024-09-02 ENCOUNTER — Telehealth: Payer: Self-pay | Admitting: Pharmacy Technician

## 2024-09-02 NOTE — Telephone Encounter (Signed)
 Pharmacy Patient Advocate Encounter   Received notification from Fax that prior authorization for Fingolimod  HCl 0.5MG  capsules  is required/requested.   Insurance verification completed.   The patient is insured through [facility].   Per test claim: PA required; PA started via CoverMyMeds. KEY [id number] . Waiting for clinical questions to populate.

## 2024-09-03 ENCOUNTER — Other Ambulatory Visit (HOSPITAL_COMMUNITY): Payer: Self-pay

## 2024-09-03 NOTE — Telephone Encounter (Signed)
 Pharmacy Patient Advocate Encounter  Received notification from [facility] that Prior Authorization for Fingolimod  has been APPROVED from 09/03/2024 to 09/03/2025   PA #/Case ID/Reference #: [id number]

## 2024-09-03 NOTE — Telephone Encounter (Signed)
 Clinical Questions have been submitted

## 2024-09-06 ENCOUNTER — Other Ambulatory Visit: Payer: Self-pay | Admitting: Family Medicine

## 2024-09-06 DIAGNOSIS — J301 Allergic rhinitis due to pollen: Secondary | ICD-10-CM

## 2024-09-11 ENCOUNTER — Ambulatory Visit (INDEPENDENT_AMBULATORY_CARE_PROVIDER_SITE_OTHER): Admitting: Family Medicine

## 2024-09-11 ENCOUNTER — Ambulatory Visit: Payer: Self-pay | Admitting: Family Medicine

## 2024-09-11 ENCOUNTER — Encounter: Payer: Self-pay | Admitting: Family Medicine

## 2024-09-11 VITALS — BP 128/76 | HR 71 | Temp 98.0°F | Resp 16 | Ht 64.0 in | Wt 140.0 lb

## 2024-09-11 DIAGNOSIS — Z0000 Encounter for general adult medical examination without abnormal findings: Secondary | ICD-10-CM | POA: Diagnosis not present

## 2024-09-11 DIAGNOSIS — Z23 Encounter for immunization: Secondary | ICD-10-CM

## 2024-09-11 DIAGNOSIS — I10 Essential (primary) hypertension: Secondary | ICD-10-CM

## 2024-09-11 DIAGNOSIS — R7303 Prediabetes: Secondary | ICD-10-CM | POA: Diagnosis not present

## 2024-09-11 DIAGNOSIS — Z125 Encounter for screening for malignant neoplasm of prostate: Secondary | ICD-10-CM

## 2024-09-11 LAB — CBC WITH DIFFERENTIAL/PLATELET
Basophils Absolute: 0.1 K/uL (ref 0.0–0.1)
Basophils Relative: 1.1 % (ref 0.0–3.0)
Eosinophils Absolute: 0.6 K/uL (ref 0.0–0.7)
Eosinophils Relative: 12.4 % — ABNORMAL HIGH (ref 0.0–5.0)
HCT: 41.3 % (ref 39.0–52.0)
Hemoglobin: 13.7 g/dL (ref 13.0–17.0)
Lymphocytes Relative: 21.2 % (ref 12.0–46.0)
Lymphs Abs: 1.1 K/uL (ref 0.7–4.0)
MCHC: 33.3 g/dL (ref 30.0–36.0)
MCV: 89 fl (ref 78.0–100.0)
Monocytes Absolute: 0.6 K/uL (ref 0.1–1.0)
Monocytes Relative: 12.9 % — ABNORMAL HIGH (ref 3.0–12.0)
Neutro Abs: 2.6 K/uL (ref 1.4–7.7)
Neutrophils Relative %: 52.4 % (ref 43.0–77.0)
Platelets: 272 K/uL (ref 150.0–400.0)
RBC: 4.63 Mil/uL (ref 4.22–5.81)
RDW: 13.6 % (ref 11.5–15.5)
WBC: 5 K/uL (ref 4.0–10.5)

## 2024-09-11 LAB — COMPREHENSIVE METABOLIC PANEL WITH GFR
ALT: 15 U/L (ref 3–53)
AST: 13 U/L (ref 5–37)
Albumin: 4.6 g/dL (ref 3.5–5.2)
Alkaline Phosphatase: 86 U/L (ref 39–117)
BUN: 18 mg/dL (ref 6–23)
CO2: 29 meq/L (ref 19–32)
Calcium: 9.3 mg/dL (ref 8.4–10.5)
Chloride: 104 meq/L (ref 96–112)
Creatinine, Ser: 1.14 mg/dL (ref 0.40–1.50)
GFR: 71.63 mL/min
Glucose, Bld: 99 mg/dL (ref 70–99)
Potassium: 4.3 meq/L (ref 3.5–5.1)
Sodium: 141 meq/L (ref 135–145)
Total Bilirubin: 0.6 mg/dL (ref 0.2–1.2)
Total Protein: 6.4 g/dL (ref 6.0–8.3)

## 2024-09-11 LAB — LIPID PANEL
Cholesterol: 179 mg/dL (ref 28–200)
HDL: 42.7 mg/dL
LDL Cholesterol: 118 mg/dL — ABNORMAL HIGH (ref 10–99)
NonHDL: 136.38
Total CHOL/HDL Ratio: 4
Triglycerides: 91 mg/dL (ref 10.0–149.0)
VLDL: 18.2 mg/dL (ref 0.0–40.0)

## 2024-09-11 LAB — PSA: PSA: 0.32 ng/mL (ref 0.10–4.00)

## 2024-09-11 LAB — HEMOGLOBIN A1C: Hgb A1c MFr Bld: 6.1 % (ref 4.6–6.5)

## 2024-09-11 MED ORDER — AMLODIPINE BESYLATE-VALSARTAN 5-320 MG PO TABS: 1.0000 | ORAL_TABLET | Freq: Every day | ORAL | 3 refills | Status: AC

## 2024-09-11 MED ORDER — PREDNISONE 20 MG PO TABS: 40.0000 mg | ORAL_TABLET | Freq: Every day | ORAL | 0 refills | Status: AC

## 2024-09-11 NOTE — Patient Instructions (Addendum)

## 2024-09-11 NOTE — Progress Notes (Addendum)
 Chief Complaint  Patient presents with   Annual Exam    CPE    Well Male [name] [name] is here for a complete physical.   His last physical was >1 year ago.  Current diet: in general, a healthy diet.  Current exercise: walking, strength training Weight trend: stable Fatigue out of ordinary? No. Seat belt? Yes.   Advanced directive? No  Health maintenance Shingrix- No Colonoscopy- Yes Tetanus- Yes HIV- Yes Hep C- Yes   Past Medical History:  Diagnosis Date   Bilateral inguinal hernia    Hyperlipidemia    Hypertension    Multiple sclerosis    Nephrolithiasis    Neuromuscular disorder (HCC)    MS     Past Surgical History:  Procedure Laterality Date   HERNIA REPAIR  08/27/2012   bilateral repair with mesh   NASAL SEPTUM SURGERY  05/30/2018   NASAL SINUS SURGERY  04/03/2021   SQUAMOUS CELL CARCINOMA EXCISION     From the forehead   VASECTOMY      Medications  Medications Ordered Prior to Encounter[1]   Allergies Allergies[2]  Family History Family History  Problem Relation Age of Onset   Hypertension Father    Colon polyps Father    Cancer Maternal Grandfather        colon   Colon cancer Neg Hx    Esophageal cancer Neg Hx    Rectal cancer Neg Hx    Stomach cancer Neg Hx    Multiple sclerosis Neg Hx     Review of Systems: Constitutional:  no fevers Eye:  no recent significant change in vision Ear/Nose/Mouth/Throat:  Ears:  no hearing loss Nose/Mouth/Throat:  +nasal congestion, no sore throat Cardiovascular:  no chest pain Respiratory:  no shortness of breath Gastrointestinal:  no change in bowel habits GU:  Male: negative for dysuria, frequency Musculoskeletal/Extremities:  no joint pain Integumentary (Skin/Breast):  no abnormal skin lesions reported Neurologic:  no headaches Endocrine: No unexpected weight changes Hematologic/Lymphatic:  no abnormal bleeding  Exam BP 128/76 (BP Location: Left Arm, Patient Position: Sitting)   Pulse 71    Temp 98 F (36.7 C) (Oral)   Resp 16   Ht 5' 4 (1.626 m)   Wt 140 lb (63.5 kg)   SpO2 98%   BMI 24.03 kg/m  General:  well developed, well nourished, in no apparent distress Skin:  no significant moles, warts, or growths Head:  no masses, lesions, or tenderness Eyes:  pupils equal and round, sclera anicteric without injection Ears:  canals without lesions, TMs shiny without retraction, no obvious effusion, no erythema Nose:  nares patent, mucosa normal Throat/Pharynx:  lips and gingiva without lesion; tongue and uvula midline; non-inflamed pharynx; no exudates or postnasal drainage Neck: neck supple without adenopathy, thyromegaly, or masses Cardiac: RRR, no bruits, no LE edema Lungs:  clear to auscultation, breath sounds equal bilaterally, no respiratory distress Abdomen: BS+, soft, non-tender, non-distended, no masses or organomegaly noted Rectal: Deferred Musculoskeletal:  symmetrical muscle groups noted without atrophy or deformity Neuro:  gait normal; deep tendon reflexes normal and symmetric Psych: well oriented with normal range of affect and appropriate judgment/insight  Assessment and Plan  Well adult exam - Plan: Comprehensive metabolic panel with GFR, Lipid panel, CBC w/Diff, Hepatitis B surface antibody,quantitative, CANCELED: CBC  Need for influenza vaccination - Plan: Flu vaccine trivalent PF, 6mos and older(Flulaval,Afluria,Fluarix,Fluzone)  Screening for prostate cancer - Plan: PSA  Prediabetes - Plan: Hemoglobin A1c  Essential hypertension - Plan: amLODipine -valsartan  (EXFORGE ) 5-320  MG tablet   Well 58 y.o. male. Counseled on diet and exercise. Counseled on risks and benefits of prostate cancer screening with PSA. The patient agrees to undergo testing. Advanced directive form requested today.  PCV20 an flu shot today. He will get the Shingrix when timing is better.  Immunizations, labs, and further orders as above. Follow up in 6 mo. The patient voiced  understanding and agreement to the plan.  Mabel Mt Pioche, DO 09/11/2024 8:26 AM     [1]  Current Outpatient Medications on File Prior to Visit  Medication Sig Dispense Refill   cetirizine  (ZYRTEC ) 10 MG tablet Take 1 tablet (10 mg total) by mouth daily. 30 tablet 11   Cholecalciferol (VITAMIN D PO) Take 5,000 Units by mouth daily.      Fingolimod  HCl 0.5 MG CAPS TAKE 1 CAPSULE BY MOUTH 1 TIME A DAY 90 capsule 3   Fingolimod  HCl 0.5 MG CAPS TAKE 1 CAPSULE BY MOUTH 1 TIME A DAY 90 capsule 3   gabapentin  (NEURONTIN ) 300 MG capsule Take 1 capsule (300 mg total) by mouth 2 (two) times daily. 180 capsule 3   montelukast  (SINGULAIR ) 10 MG tablet TAKE 1 TABLET BY MOUTH EVERYDAY AT BEDTIME 90 tablet 1   Multiple Vitamin (MULTIVITAMIN WITH MINERALS) TABS Take 1 tablet by mouth daily.     vitamin B-12 (CYANOCOBALAMIN) 1000 MCG tablet Take 1,000 mcg by mouth daily.     Current Facility-Administered Medications on File Prior to Visit  Medication Dose Route Frequency Provider Last Rate Last Admin   gadopentetate dimeglumine  (MAGNEVIST ) injection 14 mL  14 mL Intravenous Once PRN Willis, Charles K, MD      [2]  Allergies Allergen Reactions   Mold Extract [Trichophyton]     congestion   Penicillins Other (See Comments)    Reaction in childhood. Does not remember.

## 2024-09-11 NOTE — Addendum Note (Signed)
 Addended by: Colvin Blatt M on: 09/11/2024 09:11 AM   Modules accepted: Orders

## 2024-09-12 LAB — HEPATITIS B SURFACE ANTIBODY, QUANTITATIVE: Hep B S AB Quant (Post): <5 m[IU]/mL — ABNORMAL LOW

## 2025-03-12 ENCOUNTER — Ambulatory Visit: Admitting: Family Medicine
